# Patient Record
Sex: Female | Born: 1950 | Race: White | Hispanic: No | Marital: Married | Smoking: Never smoker
Health system: Southern US, Community
[De-identification: ages and names within clinical notes are randomized; demographics above are authoritative.]

## PROBLEM LIST (undated history)

## (undated) DIAGNOSIS — M459 Ankylosing spondylitis of unspecified sites in spine: Secondary | ICD-10-CM

## (undated) DIAGNOSIS — K449 Diaphragmatic hernia without obstruction or gangrene: Secondary | ICD-10-CM

## (undated) DIAGNOSIS — I10 Essential (primary) hypertension: Secondary | ICD-10-CM

## (undated) DIAGNOSIS — N951 Menopausal and female climacteric states: Secondary | ICD-10-CM

## (undated) DIAGNOSIS — R55 Syncope and collapse: Secondary | ICD-10-CM

## (undated) DIAGNOSIS — Z8711 Personal history of peptic ulcer disease: Secondary | ICD-10-CM

## (undated) DIAGNOSIS — E785 Hyperlipidemia, unspecified: Secondary | ICD-10-CM

## (undated) DIAGNOSIS — M810 Age-related osteoporosis without current pathological fracture: Secondary | ICD-10-CM

## (undated) DIAGNOSIS — Z87898 Personal history of other specified conditions: Secondary | ICD-10-CM

## (undated) DIAGNOSIS — K279 Peptic ulcer, site unspecified, unspecified as acute or chronic, without hemorrhage or perforation: Secondary | ICD-10-CM

## (undated) HISTORY — DX: Menopausal and female climacteric states: N95.1

## (undated) HISTORY — PX: BREAST REDUCTION SURGERY: SHX8

## (undated) HISTORY — DX: Hyperlipidemia, unspecified: E78.5

## (undated) HISTORY — DX: Ankylosing spondylitis of unspecified sites in spine: M45.9

## (undated) HISTORY — DX: Age-related osteoporosis without current pathological fracture: M81.0

## (undated) HISTORY — DX: Personal history of other specified conditions: Z87.898

## (undated) HISTORY — DX: Diaphragmatic hernia without obstruction or gangrene: K44.9

## (undated) HISTORY — DX: Personal history of peptic ulcer disease: Z87.11

## (undated) HISTORY — PX: COLONOSCOPY: SHX174

## (undated) HISTORY — PX: TONSILLECTOMY: SUR1361

---

## 1998-08-07 DIAGNOSIS — Z87898 Personal history of other specified conditions: Secondary | ICD-10-CM

## 1998-08-07 HISTORY — DX: Personal history of other specified conditions: Z87.898

## 2004-10-17 ENCOUNTER — Ambulatory Visit: Payer: Self-pay | Admitting: Unknown Physician Specialty

## 2005-01-03 ENCOUNTER — Ambulatory Visit: Payer: Self-pay | Admitting: Unknown Physician Specialty

## 2005-10-18 ENCOUNTER — Ambulatory Visit: Payer: Self-pay | Admitting: Internal Medicine

## 2006-12-04 ENCOUNTER — Ambulatory Visit: Payer: Self-pay | Admitting: Internal Medicine

## 2007-06-12 ENCOUNTER — Ambulatory Visit: Payer: Self-pay | Admitting: Physician Assistant

## 2007-07-10 ENCOUNTER — Ambulatory Visit: Payer: Self-pay | Admitting: Internal Medicine

## 2007-12-17 ENCOUNTER — Ambulatory Visit: Payer: Self-pay | Admitting: Internal Medicine

## 2008-06-17 ENCOUNTER — Ambulatory Visit: Payer: Self-pay | Admitting: Internal Medicine

## 2008-12-31 ENCOUNTER — Ambulatory Visit: Payer: Self-pay | Admitting: Internal Medicine

## 2010-01-11 ENCOUNTER — Ambulatory Visit: Payer: Self-pay | Admitting: Internal Medicine

## 2010-04-04 ENCOUNTER — Ambulatory Visit: Payer: Self-pay | Admitting: Unknown Physician Specialty

## 2011-01-23 ENCOUNTER — Ambulatory Visit: Payer: Self-pay | Admitting: Internal Medicine

## 2011-03-17 ENCOUNTER — Ambulatory Visit: Payer: Self-pay | Admitting: Physician Assistant

## 2011-08-08 HISTORY — PX: REDUCTION MAMMAPLASTY: SUR839

## 2011-09-05 ENCOUNTER — Ambulatory Visit: Payer: Self-pay

## 2012-01-24 ENCOUNTER — Ambulatory Visit: Payer: Self-pay | Admitting: Internal Medicine

## 2012-02-07 ENCOUNTER — Encounter: Payer: Self-pay | Admitting: Cardiovascular Disease

## 2012-02-16 ENCOUNTER — Encounter: Payer: Self-pay | Admitting: Cardiovascular Disease

## 2012-02-16 ENCOUNTER — Ambulatory Visit (INDEPENDENT_AMBULATORY_CARE_PROVIDER_SITE_OTHER): Payer: Medicare Other | Admitting: Cardiovascular Disease

## 2012-02-16 VITALS — BP 142/82 | HR 84 | Ht 58.5 in | Wt 166.5 lb

## 2012-02-16 DIAGNOSIS — I10 Essential (primary) hypertension: Secondary | ICD-10-CM | POA: Insufficient documentation

## 2012-02-16 DIAGNOSIS — K449 Diaphragmatic hernia without obstruction or gangrene: Secondary | ICD-10-CM | POA: Insufficient documentation

## 2012-02-16 DIAGNOSIS — E785 Hyperlipidemia, unspecified: Secondary | ICD-10-CM | POA: Insufficient documentation

## 2012-02-16 DIAGNOSIS — R0789 Other chest pain: Secondary | ICD-10-CM

## 2012-02-16 DIAGNOSIS — K219 Gastro-esophageal reflux disease without esophagitis: Secondary | ICD-10-CM

## 2012-02-16 NOTE — Assessment & Plan Note (Signed)
Cholesterol is at goal on the current lipid regimen. No changes to the medications were made.  

## 2012-02-16 NOTE — Progress Notes (Signed)
Patient ID: Terry Le, female    DOB: 02-04-1951, 61 y.o.   MRN: [id number]  HPI Comments: [name] is a very pleasant 61 year old woman with a history of obesity, GERD, hilar hernia, hyperlipidemia, syncope in 2000 with workup at that time but these details are unavailable, presented by referral for evaluation of chest discomfort.  She reports having a stress test several years ago which was reportedly normal. She states having years of GI symptoms. She has had workup including EGD, thoracic MRI showing peptic ulcer disease and moderate to large hiatal hernia. EGD in 2006 showing medium hiatal hernia, reactive gastropathy. She's recently been changed from one proton pump inhibitor to dexilant with improvement of her symptoms in the past 10 days. She has been weaning herself off ranitidine. No significant breakthrough symptoms. She has less waking at night time secondary to GI symptoms  She is active at baseline, helps to take care of 3 grandchildren ranging from 55-27 years old. They have very active. She went to the mall this past weekend and walked all day with no symptoms of chest discomfort. Heart rate at home has been averaging in the 70s.  EKG shows normal sinus rhythm with rate 84 beats per minute, no significant ST or T wave changes Recent labwork shows total cholesterol 160, LDL 67, HDL 53, triglycerides 200, normal LFTs, normal renal function   Outpatient Encounter Prescriptions as of 02/16/2012  Medication Sig Dispense Refill  . ALPRAZolam (XANAX) 0.25 MG tablet Take 0.25 mg by mouth at bedtime as needed.      . cyclobenzaprine (FLEXERIL) 10 MG tablet Take 10 mg by mouth 3 (three) times daily as needed.      Marland Kitchen dexlansoprazole (DEXILANT) 60 MG capsule Take 60 mg by mouth daily.      Marland Kitchen eletriptan (RELPAX) 40 MG tablet One tablet by mouth at onset of headache. May repeat in 2 hours if headache persists or recurs. may repeat in 2 hours if necessary      . multivitamin-lutein  (OCUVITE-LUTEIN) CAPS Take 1 capsule by mouth daily.      . propranolol ER (INDERAL LA) 80 MG 24 hr capsule Take 80 mg by mouth daily.      . ranitidine (ZANTAC) 300 MG tablet Take 150 mg by mouth every other day.       . rosuvastatin (CRESTOR) 10 MG tablet Take 10 mg by mouth daily.      . SUMAtriptan (IMITREX) 100 MG tablet Take 100 mg by mouth every 2 (two) hours as needed.        Review of Systems  Constitutional: Negative.   HENT: Negative.   Eyes: Negative.   Respiratory: Positive for chest tightness.   Cardiovascular: Negative.   Gastrointestinal: Negative.        GERD symptoms  Musculoskeletal: Negative.   Skin: Negative.   Neurological: Negative.   Hematological: Negative.   Psychiatric/Behavioral: Negative.   All other systems reviewed and are negative.    BP 142/82  Pulse 84  Ht 4' 10.5" (1.486 m)  Wt 166 lb 8 oz (75.524 kg)  BMI 34.21 kg/m2  Physical Exam  Nursing note and vitals reviewed. Constitutional: She is oriented to person, place, and time. She appears well-developed and well-nourished.  HENT:  Head: Normocephalic.  Nose: Nose normal.  Mouth/Throat: Oropharynx is clear and moist.  Eyes: Conjunctivae are normal. Pupils are equal, round, and reactive to light.  Neck: Normal range of motion. Neck supple. No JVD present.  Cardiovascular: Normal  rate, regular rhythm, S1 normal, S2 normal, normal heart sounds and intact distal pulses.  Exam reveals no gallop and no friction rub.   No murmur heard. Pulmonary/Chest: Effort normal and breath sounds normal. No respiratory distress. She has no wheezes. She has no rales. She exhibits no tenderness.  Abdominal: Soft. Bowel sounds are normal. She exhibits no distension. There is no tenderness.  Musculoskeletal: Normal range of motion. She exhibits no edema and no tenderness.  Lymphadenopathy:    She has no cervical adenopathy.  Neurological: She is alert and oriented to person, place, and time. Coordination normal.   Skin: Skin is warm and dry. No rash noted. No erythema.  Psychiatric: She has a normal mood and affect. Her behavior is normal. Judgment and thought content normal.         Assessment and Plan

## 2012-02-16 NOTE — Assessment & Plan Note (Signed)
Blood pressure borderline elevated today. She will monitor this at home. She could take extra half dose propranolol if needed for hypertension.

## 2012-02-16 NOTE — Assessment & Plan Note (Signed)
Atypical type chest pains likely secondary to hiatal hernia and GERD. Symptoms have resolved since she started dexilant. No symptoms with exertion. We have offered routine treadmill study. She has declined at this time which I feel is acceptable. She will call us if she has any worsening symptoms and we could set up a treadmill study.

## 2012-02-16 NOTE — Patient Instructions (Addendum)
You are doing well. No medication changes were made.  Please call for worsening chest pain  Please call us if you have new issues that need to be addressed before your next appt.  Your physician wants you to follow-up in: as needed

## 2012-02-16 NOTE — Assessment & Plan Note (Signed)
Hilar hernia is likely the cause of much of her symptoms. She has had this for years. She is not particularly interested in surgical repair if she can alleviate discomfort with dexilant and ranitidine PRN.

## 2012-08-06 ENCOUNTER — Telehealth: Payer: Self-pay

## 2012-08-06 NOTE — Telephone Encounter (Signed)
ERROR

## 2012-11-15 ENCOUNTER — Ambulatory Visit: Payer: Self-pay | Admitting: Physician Assistant

## 2013-01-27 ENCOUNTER — Ambulatory Visit: Payer: Self-pay | Admitting: Internal Medicine

## 2013-03-14 ENCOUNTER — Encounter: Payer: Self-pay | Admitting: Cardiovascular Disease

## 2013-03-14 ENCOUNTER — Ambulatory Visit (INDEPENDENT_AMBULATORY_CARE_PROVIDER_SITE_OTHER): Payer: Medicare Other | Admitting: Cardiovascular Disease

## 2013-03-14 VITALS — BP 138/86 | HR 72 | Ht <= 58 in | Wt 180.2 lb

## 2013-03-14 DIAGNOSIS — R0602 Shortness of breath: Secondary | ICD-10-CM

## 2013-03-14 DIAGNOSIS — R0789 Other chest pain: Secondary | ICD-10-CM

## 2013-03-14 DIAGNOSIS — E785 Hyperlipidemia, unspecified: Secondary | ICD-10-CM

## 2013-03-14 DIAGNOSIS — I10 Essential (primary) hypertension: Secondary | ICD-10-CM

## 2013-03-14 NOTE — Assessment & Plan Note (Signed)
Chest tightness is a typical, consistent with hiatal hernia. Prior cardiac workup negative. No further workup indicated at this time.

## 2013-03-14 NOTE — Patient Instructions (Addendum)
You are doing well. No medication changes were made.  Please call us if you have new issues that need to be addressed before your next appt.  Your physician wants you to follow-up in: 12 months.  You will receive a reminder letter in the mail two months in advance. If you don't receive a letter, please call our office to schedule the follow-up appointment. 

## 2013-03-14 NOTE — Assessment & Plan Note (Signed)
Blood pressure is well controlled on today's visit. No changes made to the medications. 

## 2013-03-14 NOTE — Assessment & Plan Note (Signed)
Cholesterol is at goal on the current lipid regimen. No changes to the medications were made.  

## 2013-03-14 NOTE — Progress Notes (Signed)
Patient ID: Terry Le, female    DOB: 1950-11-22, 62 y.o.   MRN: [id number]  HPI Comments: Terry Le is a very pleasant 62 year old woman with a history of obesity, GERD, hiatal hernia, hyperlipidemia, syncope in 2000 with workup at that time but these details are unavailable, previous history of chest pain with prior negative cardiac workup who presents for routine followup. Long history of upper chest discomfort from hiatal hernia.  Since her last clinic visit, she has had what sounds like diverticuli or diverticulitis. She had a course of antibiotics x2 with improvement of her symptoms. She reports having chronic low-grade fever at least she reports mildly elevated temperature a regular basis. Denies any recent abdominal pain. Does continue to have upper epigastric discomfort that wakes her at nighttime. Has been finds her sitting up in bed. Often times her fullness in her chest resolves with walking. She is known to have small meals before bed  She reports having a stress test several years ago which was reportedly normal.  She has had workup including EGD, thoracic MRI showing peptic ulcer disease and moderate to large hiatal hernia. EGD in 2006 showing medium hiatal hernia, reactive gastropathy. Some Improvement in her GI symptoms on dexilant.  She is active at baseline, helps to take care of 3 grandchildren ranging from 59-57 years old.  EKG shows normal sinus rhythm with rate 72 beats per minute, no significant ST or T wave changes  labwork shows total cholesterol 160, LDL 67, HDL 53, triglycerides 200, normal LFTs, normal renal function   Outpatient Encounter Prescriptions as of 03/14/2013  Medication Sig Dispense Refill  . ALPRAZolam (XANAX) 0.25 MG tablet Take 0.25 mg by mouth at bedtime as needed.      Marland Kitchen dexlansoprazole (DEXILANT) 60 MG capsule Take 60 mg by mouth daily.      Marland Kitchen eletriptan (RELPAX) 40 MG tablet One tablet by mouth at onset of headache. May repeat in 2 hours  if headache persists or recurs. may repeat in 2 hours if necessary      . ibandronate (BONIVA) 150 MG tablet Take 150 mg by mouth every 30 (thirty) days. Take in the morning with a full glass of water, on an empty stomach, and do not take anything else by mouth or lie down for the next 30 min.      . multivitamin-lutein (OCUVITE-LUTEIN) CAPS Take 1 capsule by mouth daily.      . propranolol ER (INDERAL LA) 80 MG 24 hr capsule Take 80 mg by mouth daily.      . rosuvastatin (CRESTOR) 10 MG tablet Take 10 mg by mouth daily.      . SUMAtriptan (IMITREX) 100 MG tablet Take 100 mg by mouth every 2 (two) hours as needed.        Review of Systems  Constitutional: Negative.   HENT: Negative.   Eyes: Negative.   Respiratory: Positive for chest tightness.   Cardiovascular: Negative.   Gastrointestinal: Negative.        GERD symptoms  Musculoskeletal: Negative.   Skin: Negative.   Neurological: Negative.   Psychiatric/Behavioral: Negative.   All other systems reviewed and are negative.    BP 138/86  Pulse 72  Ht 4\' 10"  (1.473 m)  Wt 180 lb 4 oz (81.761 kg)  BMI 37.68 kg/m2  Physical Exam  Nursing note and vitals reviewed. Constitutional: She is oriented to person, place, and time. She appears well-developed and well-nourished.  HENT:  Head: Normocephalic.  Nose: Nose normal.  Mouth/Throat: Oropharynx is clear and moist.  Eyes: Conjunctivae are normal. Pupils are equal, round, and reactive to light.  Neck: Normal range of motion. Neck supple. No JVD present.  Cardiovascular: Normal rate, regular rhythm, S1 normal, S2 normal, normal heart sounds and intact distal pulses.  Exam reveals no gallop and no friction rub.   No murmur heard. Pulmonary/Chest: Effort normal and breath sounds normal. No respiratory distress. She has no wheezes. She has no rales. She exhibits no tenderness.  Abdominal: Soft. Bowel sounds are normal. She exhibits no distension. There is no tenderness.   Musculoskeletal: Normal range of motion. She exhibits no edema and no tenderness.  Lymphadenopathy:    She has no cervical adenopathy.  Neurological: She is alert and oriented to person, place, and time. Coordination normal.  Skin: Skin is warm and dry. No rash noted. No erythema.  Psychiatric: She has a normal mood and affect. Her behavior is normal. Judgment and thought content normal.    Assessment and Plan

## 2013-06-12 ENCOUNTER — Other Ambulatory Visit: Payer: Self-pay

## 2013-12-11 ENCOUNTER — Ambulatory Visit: Payer: Self-pay | Admitting: Obstetrics & Gynecology

## 2013-12-11 LAB — APTT: ACTIVATED PTT: 29.7 s (ref 23.6–35.9)

## 2013-12-11 LAB — CBC
HCT: 38.9 % (ref 35.0–47.0)
HGB: 12.6 g/dL (ref 12.0–16.0)
MCH: 29.4 pg (ref 26.0–34.0)
MCHC: 32.3 g/dL (ref 32.0–36.0)
MCV: 91 fL (ref 80–100)
PLATELETS: 250 10*3/uL (ref 150–440)
RBC: 4.28 10*6/uL (ref 3.80–5.20)
RDW: 13.9 % (ref 11.5–14.5)
WBC: 6.3 10*3/uL (ref 3.6–11.0)

## 2013-12-11 LAB — BASIC METABOLIC PANEL
ANION GAP: 5 — AB (ref 7–16)
BUN: 8 mg/dL (ref 7–18)
CHLORIDE: 109 mmol/L — AB (ref 98–107)
CREATININE: 0.66 mg/dL (ref 0.60–1.30)
Calcium, Total: 8.7 mg/dL (ref 8.5–10.1)
Co2: 27 mmol/L (ref 21–32)
EGFR (African American): >60
EGFR (Non-African Amer.): >60
Glucose: 75 mg/dL (ref 65–99)
Osmolality: 278 (ref 275–301)
Potassium: 4.2 mmol/L (ref 3.5–5.1)
SODIUM: 141 mmol/L (ref 136–145)

## 2013-12-11 LAB — PROTIME-INR
INR: 0.9
Prothrombin Time: 12.5 secs (ref 11.5–14.7)

## 2013-12-16 ENCOUNTER — Ambulatory Visit: Payer: Self-pay | Admitting: Obstetrics & Gynecology

## 2013-12-17 LAB — PATHOLOGY REPORT

## 2013-12-17 LAB — HEMOGLOBIN: HGB: 12.5 g/dL (ref 12.0–16.0)

## 2014-03-10 ENCOUNTER — Ambulatory Visit: Payer: Self-pay | Admitting: Internal Medicine

## 2014-03-16 ENCOUNTER — Encounter: Payer: Self-pay | Admitting: Cardiovascular Disease

## 2014-03-16 ENCOUNTER — Ambulatory Visit (INDEPENDENT_AMBULATORY_CARE_PROVIDER_SITE_OTHER): Payer: Medicare Other | Admitting: Cardiovascular Disease

## 2014-03-16 VITALS — BP 142/98 | HR 85 | Ht 59.0 in | Wt 176.5 lb

## 2014-03-16 DIAGNOSIS — R0789 Other chest pain: Secondary | ICD-10-CM

## 2014-03-16 DIAGNOSIS — E785 Hyperlipidemia, unspecified: Secondary | ICD-10-CM

## 2014-03-16 DIAGNOSIS — I158 Other secondary hypertension: Secondary | ICD-10-CM

## 2014-03-16 NOTE — Assessment & Plan Note (Signed)
Cholesterol is at goal on the current lipid regimen. No changes to the medications were made.  

## 2014-03-16 NOTE — Patient Instructions (Signed)
You are doing well. No medication changes were made.  Please monitor your blood pressure Call us with numbers  Goal is <140 on the top, <90 on the bottom  Please call us if you have new issues that need to be addressed before your next appt.  Your physician wants you to follow-up in: 12 months.  You will receive a reminder letter in the mail two months in advance. If you don't receive a letter, please call our office to schedule the follow-up appointment.

## 2014-03-16 NOTE — Assessment & Plan Note (Signed)
She denies having any recent episodes of chest discomfort

## 2014-03-16 NOTE — Assessment & Plan Note (Signed)
We have recommended that she monitor her blood pressure at home and call our office with measurements. Medication adjustment after we see her home numbers

## 2014-03-16 NOTE — Progress Notes (Signed)
Patient ID: Terry Le, female    DOB: Aug 24, 1950, 63 y.o.   MRN: [id number]  HPI Comments: Terry Le is a very pleasant 63 year old woman with a history of obesity, GERD, hiatal hernia, hyperlipidemia, syncope in 2000 with workup at that time but these details are unavailable, previous history of chest pain with prior negative cardiac workup who presents for routine followup. Long history of upper chest discomfort from hiatal hernia. Previous diverticuli or diverticulitis. Requiring antibiotics x2 with improvement of her symptoms.  On today's visit, she reports that she has had a hysterectomy in May 2015, urinary tract infection. She has since recovered and otherwise feels well. Blood pressure has been running mildly elevated at home. She feels that her blood pressure is elevated today as she is developing a migraine headache  Total cholesterol 146, LDL 61 in June 2015  She reports having a stress test several years ago which was reportedly normal.  She has had workup including EGD, thoracic MRI showing peptic ulcer disease and moderate to large hiatal hernia. EGD in 2006 showing medium hiatal hernia, reactive gastropathy. Some Improvement in her GI symptoms on dexilant.  She is active at baseline, helps to take care of 3 grandchildren ranging from 18-56 years old.  EKG shows normal sinus rhythm with  no significant ST or T wave changes   Outpatient Encounter Prescriptions as of 03/16/2014  Medication Sig  . ALPRAZolam (XANAX) 0.25 MG tablet Take 0.25 mg by mouth at bedtime as needed.  Marland Kitchen aspirin 81 MG tablet Take 81 mg by mouth daily.  . Cholecalciferol (VITAMIN D) 2000 UNITS CAPS Take by mouth.  . dexlansoprazole (DEXILANT) 60 MG capsule Take 60 mg by mouth daily.  Marland Kitchen eletriptan (RELPAX) 40 MG tablet One tablet by mouth at onset of headache. May repeat in 2 hours if headache persists or recurs. may repeat in 2 hours if necessary  . ibandronate (BONIVA) 150 MG tablet Take 150  mg by mouth every 30 (thirty) days. Take in the morning with a full glass of water, on an empty stomach, and do not take anything else by mouth or lie down for the next 30 min.  . multivitamin-lutein (OCUVITE-LUTEIN) CAPS Take 1 capsule by mouth daily.  . propranolol ER (INDERAL LA) 80 MG 24 hr capsule Take 80 mg by mouth daily.  . rosuvastatin (CRESTOR) 10 MG tablet Take 10 mg by mouth daily.  . SUMAtriptan (IMITREX) 100 MG tablet Take 100 mg by mouth every 2 (two) hours as needed.    Review of Systems  Constitutional: Negative.   HENT: Negative.   Eyes: Negative.   Respiratory: Positive for chest tightness.   Cardiovascular: Negative.   Gastrointestinal: Negative.        GERD symptoms  Endocrine: Negative.   Musculoskeletal: Negative.   Skin: Negative.   Allergic/Immunologic: Negative.   Neurological: Negative.   Hematological: Negative.   Psychiatric/Behavioral: Negative.   All other systems reviewed and are negative.   BP 142/98  Ht 4\' 11"  (1.499 m)  Wt 176 lb 8 oz (80.06 kg)  BMI 35.63 kg/m2  Physical Exam  Nursing note and vitals reviewed. Constitutional: She is oriented to person, place, and time. She appears well-developed and well-nourished.  HENT:  Head: Normocephalic.  Nose: Nose normal.  Mouth/Throat: Oropharynx is clear and moist.  Eyes: Conjunctivae are normal. Pupils are equal, round, and reactive to light.  Neck: Normal range of motion. Neck supple. No JVD present.  Cardiovascular: Normal rate, regular rhythm, S1  normal, S2 normal, normal heart sounds and intact distal pulses.  Exam reveals no gallop and no friction rub.   No murmur heard. Pulmonary/Chest: Effort normal and breath sounds normal. No respiratory distress. She has no wheezes. She has no rales. She exhibits no tenderness.  Abdominal: Soft. Bowel sounds are normal. She exhibits no distension. There is no tenderness.  Musculoskeletal: Normal range of motion. She exhibits no edema and no  tenderness.  Lymphadenopathy:    She has no cervical adenopathy.  Neurological: She is alert and oriented to person, place, and time. Coordination normal.  Skin: Skin is warm and dry. No rash noted. No erythema.  Psychiatric: She has a normal mood and affect. Her behavior is normal. Judgment and thought content normal.    Assessment and Plan

## 2014-03-23 ENCOUNTER — Encounter: Payer: Self-pay | Admitting: Cardiovascular Disease

## 2014-03-24 ENCOUNTER — Other Ambulatory Visit: Payer: Self-pay | Admitting: Cardiovascular Disease

## 2014-03-25 NOTE — Telephone Encounter (Signed)
Would send in losartan 25 mg daily Continue to monitor blood pressure

## 2014-03-26 MED ORDER — LOSARTAN POTASSIUM 25 MG PO TABS: 25.0000 mg | ORAL_TABLET | Freq: Every day | ORAL | Status: DC

## 2014-03-26 NOTE — Telephone Encounter (Signed)
This encounter was created in error - please disregard.

## 2014-08-25 ENCOUNTER — Encounter (INDEPENDENT_AMBULATORY_CARE_PROVIDER_SITE_OTHER): Payer: Medicare Other | Admitting: Ophthalmology

## 2014-08-25 DIAGNOSIS — H3531 Nonexudative age-related macular degeneration: Secondary | ICD-10-CM

## 2014-08-25 DIAGNOSIS — H35033 Hypertensive retinopathy, bilateral: Secondary | ICD-10-CM

## 2014-08-25 DIAGNOSIS — H43813 Vitreous degeneration, bilateral: Secondary | ICD-10-CM

## 2014-08-25 DIAGNOSIS — I10 Essential (primary) hypertension: Secondary | ICD-10-CM

## 2014-11-28 NOTE — Op Note (Signed)
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   PROCEDURE PERFORMED: Total vaginal hysterectomy with bilateral salpingo-oophorectomy and anterior colporrhaphy.   SURGEON:  Barnett Applebaum, M.D.   ASSISTANT:  Malachy Mood, M.D.   ANESTHESIA:  General.   ESTIMATED BLOOD LOSS:  25 mL.   COMPLICATIONS:  None.   FINDINGS:  Small uterus with atrophic ovaries.   DISPOSITION:  To the recovery room in stable condition.   TECHNIQUE:  The patient is prepped and draped in the usual sterile fashion after adequate anesthesia is obtained in the dorsal lithotomy position. The bladder is drained with a Robinson catheter. A speculum is placed and the cervix is identified and grasped with a double-tooth tenaculum. The circumference of the cervix is infiltrated with 1% lidocaine with epinephrine and then the circumference of the cervix is incised using Bovie electrocautery. Dissection with Metzenbaum scissors into the posterior peritoneum is performed, and a long weighted speculum is placed. The uterosacral ligaments were identified, clamped, transected and suture ligated, and then sutured to the vaginal cuff. The uterine arteries and a portion of the broad ligament connected with this is then clamped, transected and suture ligated. The broad ligament complex up to the cornua is then clamped, transected and suture ligated with amputation of the uterus with cervix. The right and left fallopian tubes and their ovaries are identified and grasped with a Babcock clamp. The infundibulopelvic blood vessels and pedicle were then clamped, transected  and suture ligated. Inspection of bowel and bladder reveals no injury or bleeding at this time.   The peritoneum is closed with a  running 0 Vicryl suture in a pursestring fashion. The uterosacral ligaments are plicated using a #1 Ethibond suture.   The anterior vaginal wall is identified with the distal cystocele. Allis clamps are placed along the midline and an incision is made with the scalpel. The endopelvic fascia is dissected away from the vaginal mucosa. The endopelvic fascia is then plicated with two 0 Vicryl sutures. Excess vaginal mucosa is excised. The vaginal mucosa is then closed with a running 2-0 Vicryl suture in a locking fashion to incorporate both the anterior colporrhaphy site and the vaginal hysterectomy site. The vaginal cavity is irrigated with saline. A packing sponge is placed coated with Premarin vaginal cream. A Foley catheter is placed. The patient goes to the recovery room in stable condition. All sponge, instrument and needle counts are correct at the conclusion of the case.      ____________________________ R. Barnett Applebaum, MD rph:dmm D: 12/16/2013 12:51:49 ET T: 12/16/2013 13:22:50 ET JOB#: [id number]  cc: Glean Salen, MD, <Dictator> Gae Dry MD ELECTRONICALLY SIGNED 12/16/2013 14:58

## 2015-02-15 ENCOUNTER — Other Ambulatory Visit: Payer: Self-pay | Admitting: Internal Medicine

## 2015-02-15 DIAGNOSIS — Z1231 Encounter for screening mammogram for malignant neoplasm of breast: Secondary | ICD-10-CM

## 2015-03-15 ENCOUNTER — Ambulatory Visit
Admission: RE | Admit: 2015-03-15 | Discharge: 2015-03-15 | Disposition: A | Discharge status: Home / Self Care | Payer: Medicare Other | Source: Ambulatory Visit | Attending: Internal Medicine | Admitting: Internal Medicine

## 2015-03-15 ENCOUNTER — Other Ambulatory Visit: Payer: Self-pay | Admitting: Internal Medicine

## 2015-03-15 DIAGNOSIS — Z1231 Encounter for screening mammogram for malignant neoplasm of breast: Secondary | ICD-10-CM | POA: Diagnosis not present

## 2015-03-31 ENCOUNTER — Encounter: Payer: Self-pay | Admitting: Cardiovascular Disease

## 2015-03-31 ENCOUNTER — Ambulatory Visit (INDEPENDENT_AMBULATORY_CARE_PROVIDER_SITE_OTHER): Payer: Medicare Other | Admitting: Cardiovascular Disease

## 2015-03-31 VITALS — BP 110/76 | HR 87 | Ht <= 58 in | Wt 174.2 lb

## 2015-03-31 DIAGNOSIS — E785 Hyperlipidemia, unspecified: Secondary | ICD-10-CM

## 2015-03-31 DIAGNOSIS — R0789 Other chest pain: Secondary | ICD-10-CM

## 2015-03-31 DIAGNOSIS — I158 Other secondary hypertension: Secondary | ICD-10-CM

## 2015-03-31 NOTE — Assessment & Plan Note (Signed)
Blood pressure is well controlled on today's visit. No changes made to the medications. 

## 2015-03-31 NOTE — Progress Notes (Signed)
Patient ID: Terry Le, female    DOB: 1950-08-08, 64 y.o.   MRN: [id number]  HPI Comments: Terry Le is a very pleasant 64 year old woman with a history of obesity, GERD, hiatal hernia, hyperlipidemia, syncope in 2000 with workup at that time but these details are unavailable, previous history of chest pain with prior negative cardiac workup who presents for routine followup of her hypertension and previous episodes of chest pain. Long history of upper chest discomfort from hiatal hernia. Previous diverticuli or diverticulitis. Requiring antibiotics x2 with improvement of her symptoms.  In follow-up today, she reports having no significant chest pain Hiatal hernia symptoms have been well-controlled No regular exercise program but is active Lab work through primary care recently done shows well controlled cholesterol, other labs are normal. Numbers reviewed with her  EKG on today's visit shows normal sinus rhythm with rate 87 bpm, no significant ST or T-wave changes  Other past medical history  hysterectomy in May 2015, urinary tract infection.  She reports having a stress test several years ago which was reportedly normal.  She has had workup including EGD, thoracic MRI showing peptic ulcer disease and moderate to large hiatal hernia. EGD in 2006 showing medium hiatal hernia, reactive gastropathy. Some Improvement in her GI symptoms on dexilant.  She is active at baseline, helps to take care of 3 grandchildren ranging from 85-17 years old.  No Known Allergies  Current Outpatient Prescriptions on File Prior to Visit  Medication Sig Dispense Refill  . ALPRAZolam (XANAX) 0.25 MG tablet Take 0.25 mg by mouth at bedtime as needed.    Marland Kitchen aspirin 81 MG tablet Take 81 mg by mouth daily.    Marland Kitchen dexlansoprazole (DEXILANT) 60 MG capsule Take 60 mg by mouth daily.    Marland Kitchen eletriptan (RELPAX) 40 MG tablet One tablet by mouth at onset of headache. May repeat in 2 hours if headache persists or  recurs. may repeat in 2 hours if necessary    . ibandronate (BONIVA) 150 MG tablet Take 150 mg by mouth every 30 (thirty) days. Take in the morning with a full glass of water, on an empty stomach, and do not take anything else by mouth or lie down for the next 30 min.    . propranolol ER (INDERAL LA) 80 MG 24 hr capsule Take 80 mg by mouth daily.    . rosuvastatin (CRESTOR) 10 MG tablet Take 10 mg by mouth daily.    . SUMAtriptan (IMITREX) 100 MG tablet Take 100 mg by mouth every 2 (two) hours as needed.     No current facility-administered medications on file prior to visit.    Past Medical History  Diagnosis Date  . Hyperlipidemia   . History of syncope 2000    w/negative work up  . History of peptic ulcer disease   . Ankylosing spondylitis   . Osteoporosis   . Menopausal state   . Hiatal hernia     Past Surgical History  Procedure Laterality Date  . Breast reduction surgery    . Tonsillectomy    . Colonoscopy    . Reduction mammaplasty  2015    Social History  reports that she has never smoked. She does not have any smokeless tobacco history on file. She reports that she does not drink alcohol or use illicit drugs.  Family History family history includes Breast cancer (age of onset: 47) in her maternal aunt; Heart attack in her father.   Review of Systems  Constitutional: Negative.  Respiratory: Negative.   Cardiovascular: Negative.   Gastrointestinal: Negative.        GERD symptoms  Musculoskeletal: Negative.   Skin: Negative.   Allergic/Immunologic: Negative.   Neurological: Negative.   Hematological: Negative.   Psychiatric/Behavioral: Negative.   All other systems reviewed and are negative.   BP 110/76 mmHg  Pulse 87  Ht 4\' 10"  (1.473 m)  Wt 174 lb 4 oz (79.039 kg)  BMI 36.43 kg/m2  Physical Exam  Constitutional: She is oriented to person, place, and time. She appears well-developed and well-nourished.  HENT:  Head: Normocephalic.  Nose: Nose  normal.  Mouth/Throat: Oropharynx is clear and moist.  Eyes: Conjunctivae are normal. Pupils are equal, round, and reactive to light.  Neck: Normal range of motion. Neck supple. No JVD present.  Cardiovascular: Normal rate, regular rhythm, S1 normal, S2 normal, normal heart sounds and intact distal pulses.  Exam reveals no gallop and no friction rub.   No murmur heard. Pulmonary/Chest: Effort normal and breath sounds normal. No respiratory distress. She has no wheezes. She has no rales. She exhibits no tenderness.  Abdominal: Soft. Bowel sounds are normal. She exhibits no distension. There is no tenderness.  Musculoskeletal: Normal range of motion. She exhibits no edema or tenderness.  Lymphadenopathy:    She has no cervical adenopathy.  Neurological: She is alert and oriented to person, place, and time. Coordination normal.  Skin: Skin is warm and dry. No rash noted. No erythema.  Psychiatric: She has a normal mood and affect. Her behavior is normal. Judgment and thought content normal.    Assessment and Plan  Nursing note and vitals reviewed.

## 2015-03-31 NOTE — Assessment & Plan Note (Signed)
No recent chest pain symptoms. No further workup at this time

## 2015-03-31 NOTE — Assessment & Plan Note (Signed)
Cholesterol is at goal on the current lipid regimen. No changes to the medications were made.  

## 2015-03-31 NOTE — Patient Instructions (Signed)
You are doing well. No medication changes were made.  Please call us if you have new issues that need to be addressed before your next appt.  Your physician wants you to follow-up in: 12 months.  You will receive a reminder letter in the mail two months in advance. If you don't receive a letter, please call our office to schedule the follow-up appointment. 

## 2015-04-27 ENCOUNTER — Encounter: Payer: Self-pay | Admitting: *Deleted

## 2015-04-28 ENCOUNTER — Encounter: Payer: Self-pay | Admitting: *Deleted

## 2015-04-28 ENCOUNTER — Ambulatory Visit
Admission: RE | Admit: 2015-04-28 | Discharge: 2015-04-28 | Disposition: A | Discharge status: Home / Self Care | Payer: Medicare Other | Source: Ambulatory Visit | Attending: Unknown Physician Specialty | Admitting: Unknown Physician Specialty

## 2015-04-28 ENCOUNTER — Ambulatory Visit: Payer: Medicare Other | Admitting: Certified Registered Nurse Anesthetist

## 2015-04-28 ENCOUNTER — Encounter
Admission: RE | Disposition: A | Discharge status: Home / Self Care | Payer: Self-pay | Source: Ambulatory Visit | Attending: Unknown Physician Specialty

## 2015-04-28 DIAGNOSIS — G709 Myoneural disorder, unspecified: Secondary | ICD-10-CM | POA: Diagnosis not present

## 2015-04-28 DIAGNOSIS — K640 First degree hemorrhoids: Secondary | ICD-10-CM | POA: Insufficient documentation

## 2015-04-28 DIAGNOSIS — I10 Essential (primary) hypertension: Secondary | ICD-10-CM | POA: Insufficient documentation

## 2015-04-28 DIAGNOSIS — Z780 Asymptomatic menopausal state: Secondary | ICD-10-CM | POA: Insufficient documentation

## 2015-04-28 DIAGNOSIS — Z7982 Long term (current) use of aspirin: Secondary | ICD-10-CM | POA: Diagnosis not present

## 2015-04-28 DIAGNOSIS — Z86010 Personal history of colonic polyps: Secondary | ICD-10-CM | POA: Diagnosis present

## 2015-04-28 DIAGNOSIS — E785 Hyperlipidemia, unspecified: Secondary | ICD-10-CM | POA: Insufficient documentation

## 2015-04-28 DIAGNOSIS — Z8711 Personal history of peptic ulcer disease: Secondary | ICD-10-CM | POA: Diagnosis not present

## 2015-04-28 DIAGNOSIS — Z79899 Other long term (current) drug therapy: Secondary | ICD-10-CM | POA: Diagnosis not present

## 2015-04-28 DIAGNOSIS — M810 Age-related osteoporosis without current pathological fracture: Secondary | ICD-10-CM | POA: Insufficient documentation

## 2015-04-28 DIAGNOSIS — D120 Benign neoplasm of cecum: Secondary | ICD-10-CM | POA: Insufficient documentation

## 2015-04-28 DIAGNOSIS — Z7952 Long term (current) use of systemic steroids: Secondary | ICD-10-CM | POA: Diagnosis not present

## 2015-04-28 HISTORY — DX: Peptic ulcer, site unspecified, unspecified as acute or chronic, without hemorrhage or perforation: K27.9

## 2015-04-28 HISTORY — DX: Syncope and collapse: R55

## 2015-04-28 HISTORY — PX: COLONOSCOPY WITH PROPOFOL: SHX5780

## 2015-04-28 HISTORY — DX: Essential (primary) hypertension: I10

## 2015-04-28 SURGERY — COLONOSCOPY WITH PROPOFOL
Anesthesia: General

## 2015-04-28 MED ORDER — SODIUM CHLORIDE 0.9 % IV SOLN: INTRAVENOUS | Status: DC

## 2015-04-28 MED ORDER — PROPOFOL 10 MG/ML IV BOLUS
INTRAVENOUS | Status: DC | PRN
  Administered 2015-04-28: 15 mg via INTRAVENOUS
  Administered 2015-04-28: 55 mg via INTRAVENOUS
  Administered 2015-04-28: 15 mg via INTRAVENOUS

## 2015-04-28 MED ORDER — FENTANYL CITRATE (PF) 100 MCG/2ML IJ SOLN
INTRAMUSCULAR | Status: DC | PRN
  Administered 2015-04-28: 50 ug via INTRAVENOUS

## 2015-04-28 MED ORDER — MIDAZOLAM HCL 5 MG/5ML IJ SOLN
INTRAMUSCULAR | Status: DC | PRN
  Administered 2015-04-28: 1 mg via INTRAVENOUS

## 2015-04-28 MED ORDER — SODIUM CHLORIDE 0.9 % IV SOLN
INTRAVENOUS | Status: DC
  Administered 2015-04-28: 13:00:00 via INTRAVENOUS

## 2015-04-28 MED ORDER — PROPOFOL INFUSION 10 MG/ML OPTIME
INTRAVENOUS | Status: DC | PRN
  Administered 2015-04-28: 100 ug/kg/min via INTRAVENOUS

## 2015-04-28 NOTE — Op Note (Signed)
[facility] Patient Name: Terry Le Procedure Date: 04/28/2015 1:12 PM MRN: [id number] Account #: [id number] Date of Birth: [birth year] Admit Type: Outpatient Age: 64 Room: [facility] ENDO ROOM 4 Gender: Female Note Status: Finalized Procedure:         Colonoscopy Indications:       Follow-up for history of adenomatous polyps in the colon Providers:         Manya Silvas, MD Referring MD:      Leonie Douglas. Doy Hutching, MD (Referring MD) Medicines:         Propofol per Anesthesia Complications:     No immediate complications. Procedure:         Pre-Anesthesia Assessment:                    - After reviewing the risks and benefits, the patient was                     deemed in satisfactory condition to undergo the procedure.                    After obtaining informed consent, the colonoscope was                     passed under direct vision. Throughout the procedure, the                     patient's blood pressure, pulse, and oxygen saturations                     were monitored continuously. The Colonoscope was                     introduced through the anus and advanced to the the cecum,                     identified by appendiceal orifice and ileocecal valve. The                     patient tolerated the procedure well. The colonoscopy was                     somewhat difficult due to significant looping and a                     tortuous colon. Successful completion of the procedure was                     aided by applying abdominal pressure. The patient                     tolerated the procedure well. The quality of the bowel                     preparation was excellent. Findings:      Internal hemorrhoids were found during endoscopy. The hemorrhoids were       small and Grade I (internal hemorrhoids that do not prolapse).      The exam was otherwise without abnormality.      A diminutive polyp was found in the cecum. The polyp was  sessile. The       polyp was removed with a jumbo cold forceps. Resection and retrieval       were complete. Impression:        -  Internal hemorrhoids.                    - The examination was otherwise normal.                    - No specimens collected. Recommendation:    - Await pathology results. Manya Silvas, MD 04/28/2015 1:45:50 PM This report has been signed electronically. Number of Addenda: 0 Note Initiated On: 04/28/2015 1:12 PM Scope Withdrawal Time: 0 hours 8 minutes 39 seconds  Total Procedure Duration: 0 hours 23 minutes 11 seconds       [facility]

## 2015-04-28 NOTE — Anesthesia Preprocedure Evaluation (Signed)
Anesthesia Evaluation  Patient identified by MRN, date of birth, ID band Patient awake    Reviewed: Allergy & Precautions, H&P , NPO status , Patient's Chart, lab work & pertinent test results  Airway Mallampati: III  TM Distance: >3 FB Neck ROM: limited    Dental  (+) Poor Dentition   Pulmonary neg pulmonary ROS,    Pulmonary exam normal breath sounds clear to auscultation       Cardiovascular Exercise Tolerance: Good hypertension, (-) Past MI Normal cardiovascular exam Rhythm:regular Rate:Normal     Neuro/Psych  Neuromuscular disease negative psych ROS   GI/Hepatic Neg liver ROS, hiatal hernia, PUD, GERD  Medicated and Controlled,  Endo/Other  negative endocrine ROS  Renal/GU negative Renal ROS  negative genitourinary   Musculoskeletal   Abdominal   Peds  Hematology negative hematology ROS (+)   Anesthesia Other Findings Past Medical History:   Hyperlipidemia                                               History of syncope                              2000           Comment:w/negative work up   History of peptic ulcer disease                              Ankylosing spondylitis                                       Osteoporosis                                                 Menopausal state                                             Hiatal hernia                                                Hypertension                                                 Osteoporosis                                                 Hyperlipidemia  Ankylosing spondylitis                                       Syncope                                                      PUD (peptic ulcer disease)                                   Reproductive/Obstetrics negative OB ROS                             Anesthesia Physical Anesthesia Plan  ASA: III  Anesthesia  Plan: General   Post-op Pain Management:    Induction:   Airway Management Planned:   Additional Equipment:   Intra-op Plan:   Post-operative Plan:   Informed Consent: I have reviewed the patients History and Physical, chart, labs and discussed the procedure including the risks, benefits and alternatives for the proposed anesthesia with the patient or authorized representative who has indicated his/her understanding and acceptance.   Dental Advisory Given  Plan Discussed with: Anesthesiologist, CRNA and Surgeon  Anesthesia Plan Comments:         Anesthesia Quick Evaluation

## 2015-04-28 NOTE — Anesthesia Postprocedure Evaluation (Signed)
  Anesthesia Post-op Note  Patient: Terry Le  Procedure(s) Performed: Procedure(s): COLONOSCOPY WITH PROPOFOL (N/A)  Anesthesia type:General  Patient location: PACU  Post pain: Pain level controlled  Post assessment: Post-op Vital signs reviewed, Patient's Cardiovascular Status Stable, Respiratory Function Stable, Patent Airway and No signs of Nausea or vomiting  Post vital signs: Reviewed and stable  Last Vitals:  Filed Vitals:   04/28/15 1415  BP: 118/77  Pulse: 90  Temp:   Resp: 15    Level of consciousness: awake, alert  and patient cooperative  Complications: No apparent anesthesia complications

## 2015-04-28 NOTE — Transfer of Care (Signed)
Immediate Anesthesia Transfer of Care Note  Patient: Terry Le  Procedure(s) Performed: Procedure(s): COLONOSCOPY WITH PROPOFOL (N/A)  Patient Location: PACU  Anesthesia Type:General  Level of Consciousness: sedated  Airway & Oxygen Therapy: Patient Spontanous Breathing and Patient connected to nasal cannula oxygen  Post-op Assessment: Report given to RN and Post -op Vital signs reviewed and stable  Post vital signs: Reviewed and stable  Last Vitals:  Filed Vitals:   04/28/15 1345  BP: 102/77  Pulse:   Temp: 36 C  Resp: 17    Complications: No apparent anesthesia complications

## 2015-04-28 NOTE — H&P (Signed)
Primary Care Physician:  Idelle Crouch, MD Primary Gastroenterologist:  Dr. Vira Agar  Pre-Procedure History & Physical: HPI:  Terry Le is a 64 y.o. female is here for an colonoscopy.   Past Medical History  Diagnosis Date  . Hyperlipidemia   . History of syncope 2000    w/negative work up  . History of peptic ulcer disease   . Ankylosing spondylitis   . Osteoporosis   . Menopausal state   . Hiatal hernia   . Hypertension   . Osteoporosis   . Hyperlipidemia   . Ankylosing spondylitis   . Syncope   . PUD (peptic ulcer disease)     Past Surgical History  Procedure Laterality Date  . Breast reduction surgery    . Tonsillectomy    . Colonoscopy    . Reduction mammaplasty  2015    Prior to Admission medications   Medication Sig Start Date End Date Taking? Authorizing Provider  ALPRAZolam (XANAX) 0.25 MG tablet Take 0.25 mg by mouth at bedtime as needed.   Yes Historical Provider, MD  aspirin 81 MG tablet Take 81 mg by mouth daily.   Yes Historical Provider, MD  Cholecalciferol (VITAMIN D PO) Take 5,000 mg by mouth daily.   Yes Historical Provider, MD  dexlansoprazole (DEXILANT) 60 MG capsule Take 60 mg by mouth daily.   Yes Historical Provider, MD  eletriptan (RELPAX) 40 MG tablet One tablet by mouth at onset of headache. May repeat in 2 hours if headache persists or recurs. may repeat in 2 hours if necessary   Yes Historical Provider, MD  ibandronate (BONIVA) 150 MG tablet Take 150 mg by mouth every 30 (thirty) days. Take in the morning with a full glass of water, on an empty stomach, and do not take anything else by mouth or lie down for the next 30 min.   Yes Historical Provider, MD  losartan (COZAAR) 50 MG tablet Take 50 mg by mouth daily.   Yes Historical Provider, MD  Multiple Vitamins-Minerals (PRESERVISION AREDS 2 PO) Take by mouth daily.   Yes Historical Provider, MD  propranolol ER (INDERAL LA) 80 MG 24 hr capsule Take 80 mg by mouth daily.   Yes  Historical Provider, MD  rosuvastatin (CRESTOR) 10 MG tablet Take 10 mg by mouth daily.   Yes Historical Provider, MD  SUMAtriptan (IMITREX) 100 MG tablet Take 100 mg by mouth every 2 (two) hours as needed.   Yes Historical Provider, MD  predniSONE (STERAPRED UNI-PAK 21 TAB) 10 MG (21) TBPK tablet Take 10 mg by mouth as directed.    Historical Provider, MD    Allergies as of 04/06/2015  . (No Known Allergies)    Family History  Problem Relation Age of Onset  . Heart attack Father   . Breast cancer Maternal Aunt 34    Social History   Social History  . Marital Status: Married    Spouse Name: N/A  . Number of Children: N/A  . Years of Education: N/A   Occupational History  . Not on file.   Social History Main Topics  . Smoking status: Never Smoker   . Smokeless tobacco: Not on file  . Alcohol Use: No  . Drug Use: No  . Sexual Activity: Not on file   Other Topics Concern  . Not on file   Social History Narrative    Review of Systems: See HPI, otherwise negative ROS  Physical Exam: BP 145/99 mmHg  Pulse 99  Temp(Src) 98.9 F (  37.2 C) (Tympanic)  Resp 16  Ht 4\' 10"  (1.473 m)  Wt 78.926 kg (174 lb)  BMI 36.38 kg/m2  SpO2 99% General:   Alert,  pleasant and cooperative in NAD Head:  Normocephalic and atraumatic. Neck:  Supple; no masses or thyromegaly. Lungs:  Clear throughout to auscultation.    Heart:  Regular rate and rhythm. Abdomen:  Soft, nontender and nondistended. Normal bowel sounds, without guarding, and without rebound.   Neurologic:  Alert and  oriented x4;  grossly normal neurologically.  Impression/Plan: Terry Le is here for an colonoscopy to be performed for [facility] colon polyps  Risks, benefits, limitations, and alternatives regarding  colonoscopy have been reviewed with the patient.  Questions have been answered.  All parties agreeable.   Gaylyn Cheers, MD  04/28/2015, 1:12 PM

## 2015-04-29 ENCOUNTER — Encounter: Payer: Self-pay | Admitting: Unknown Physician Specialty

## 2015-04-29 LAB — SURGICAL PATHOLOGY

## 2016-03-09 ENCOUNTER — Other Ambulatory Visit: Payer: Self-pay | Admitting: Internal Medicine

## 2016-03-09 DIAGNOSIS — Z1231 Encounter for screening mammogram for malignant neoplasm of breast: Secondary | ICD-10-CM

## 2016-03-22 ENCOUNTER — Other Ambulatory Visit: Payer: Self-pay | Admitting: Internal Medicine

## 2016-03-22 ENCOUNTER — Ambulatory Visit
Admission: RE | Admit: 2016-03-22 | Discharge: 2016-03-22 | Disposition: A | Discharge status: Home / Self Care | Payer: Medicare Other | Source: Ambulatory Visit | Attending: Internal Medicine | Admitting: Internal Medicine

## 2016-03-22 DIAGNOSIS — Z1231 Encounter for screening mammogram for malignant neoplasm of breast: Secondary | ICD-10-CM | POA: Insufficient documentation

## 2016-06-14 ENCOUNTER — Telehealth: Payer: Self-pay | Admitting: Cardiovascular Disease

## 2016-06-14 NOTE — Telephone Encounter (Signed)
Patient states she is doing fine and doesn't need to fu

## 2016-06-14 NOTE — Telephone Encounter (Signed)
Deleting recall.

## 2016-11-15 IMAGING — MG MM SCREENING BREAST TOMO BILATERAL
8 of 13 series · 8 of 29 positions shown · non-contrast
Comparison: Previous exam(s).

CLINICAL DATA: Screening.

EXAM:
DIGITAL SCREENING BILATERAL MAMMOGRAM WITH 3D TOMO WITH CAD

[R MLO]
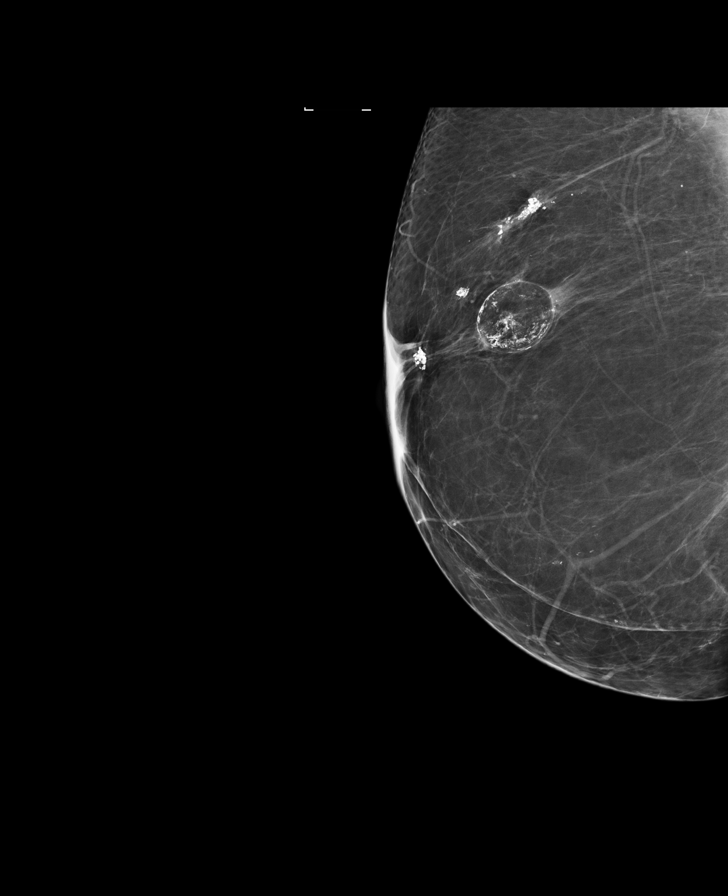

[R CC synth-2D]
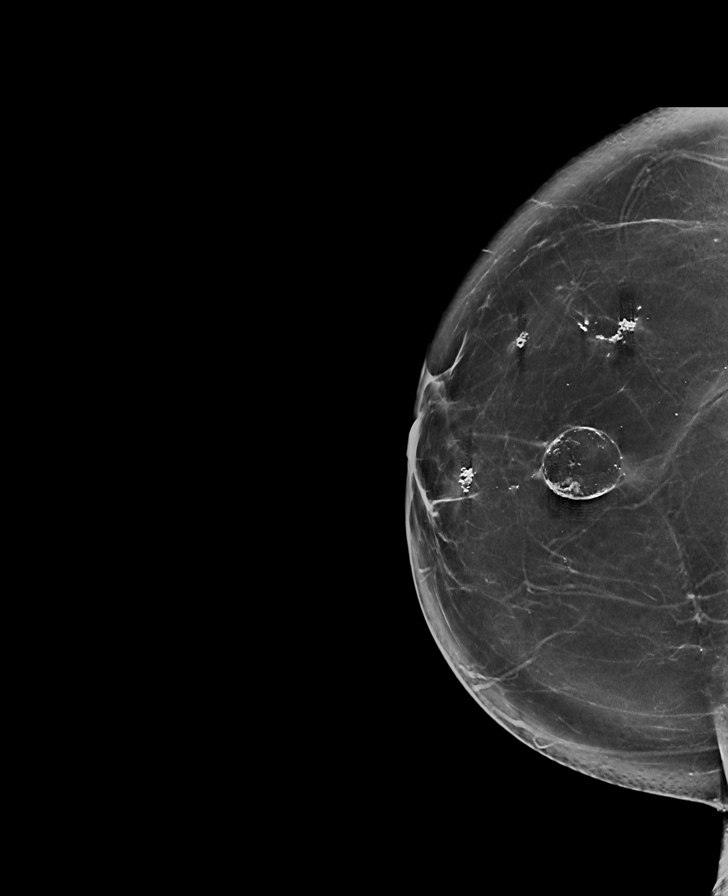

[L CC]
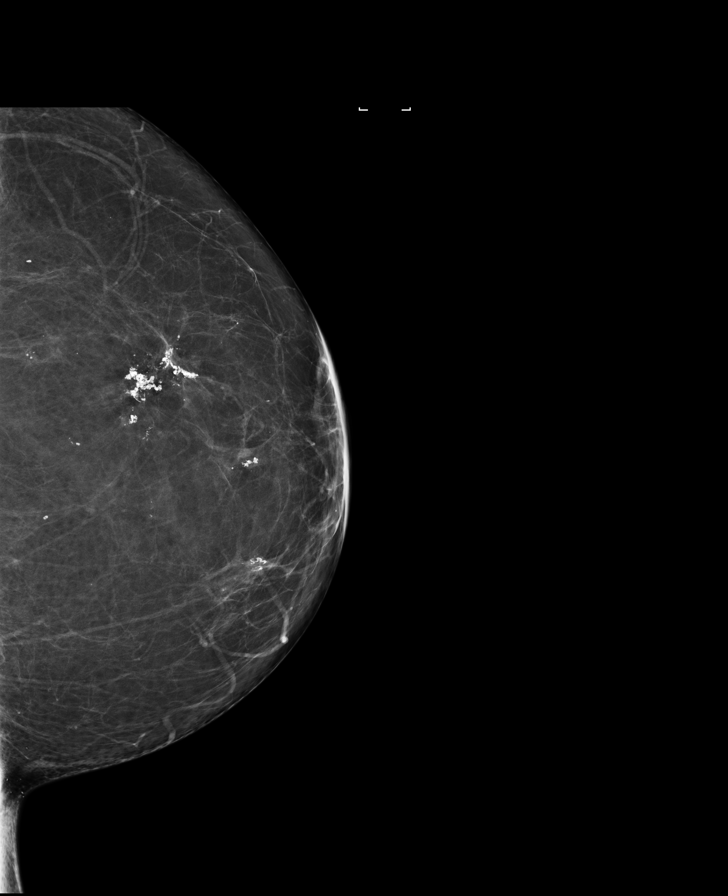

[L CC synth-2D]
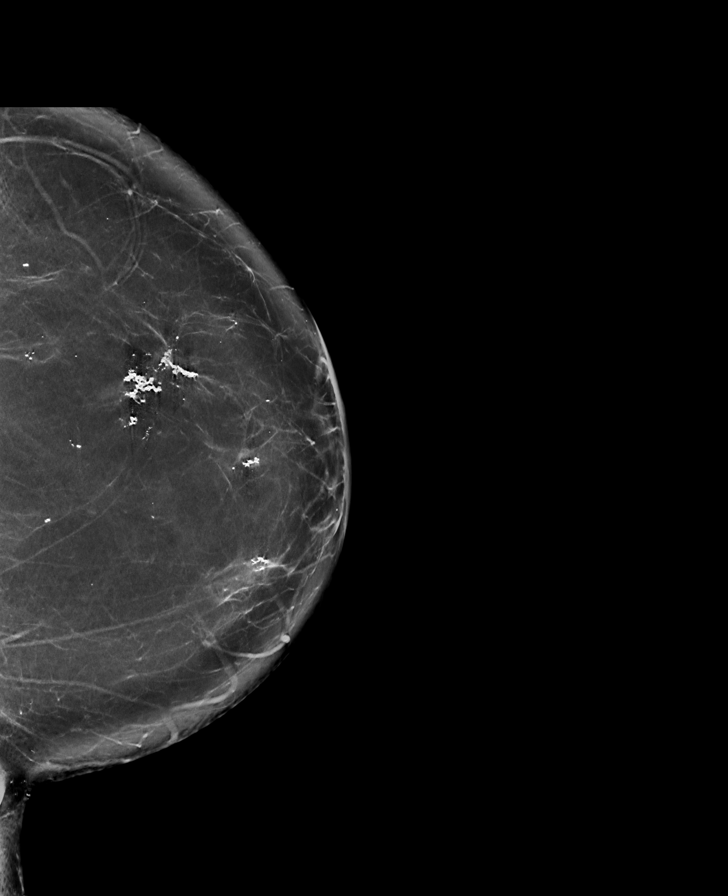

[R MLO synth-2D]
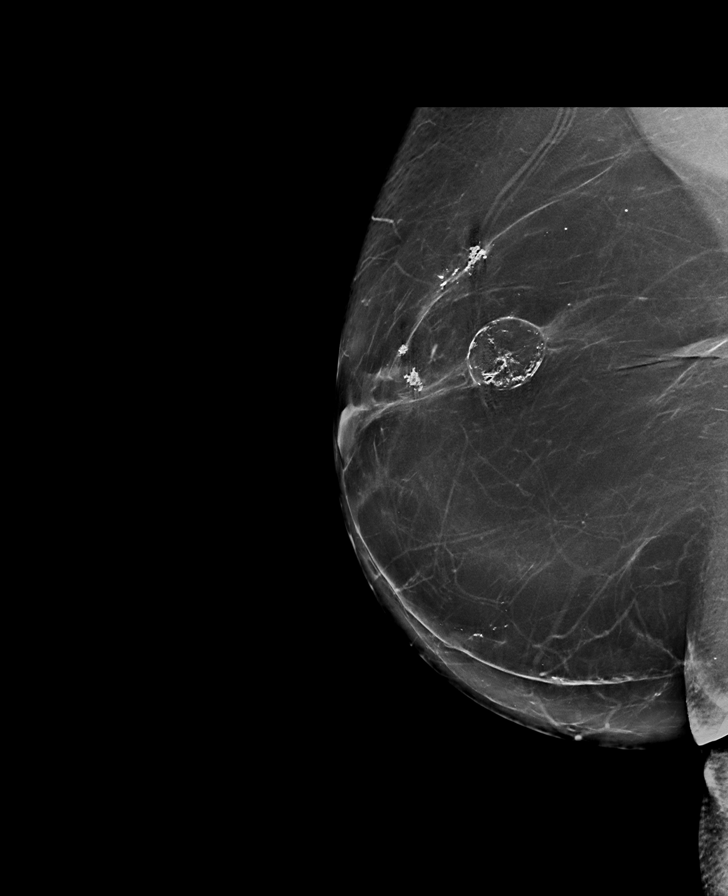

[L MLO]
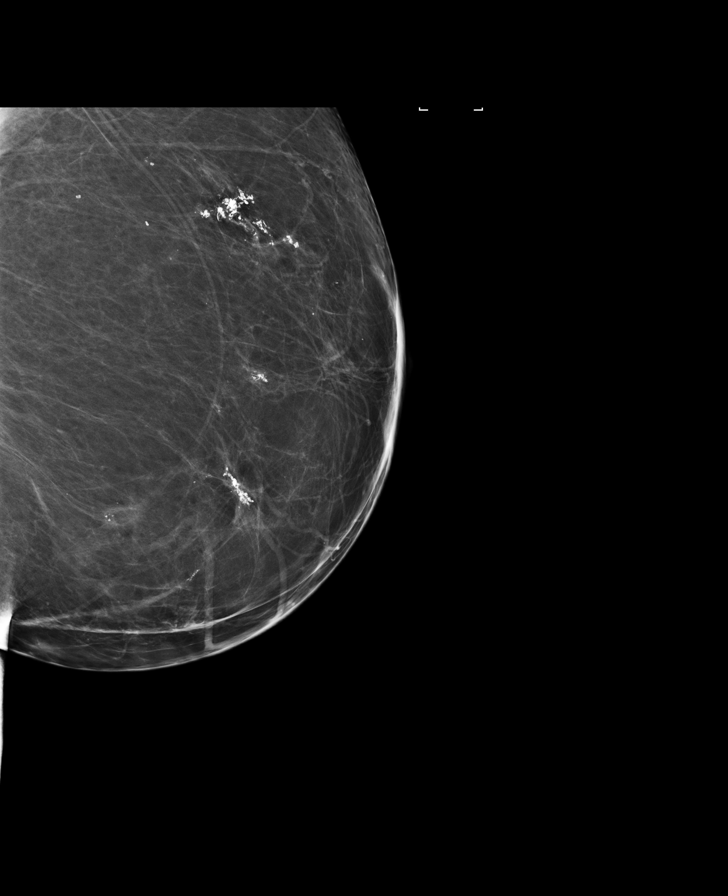

[R CC]
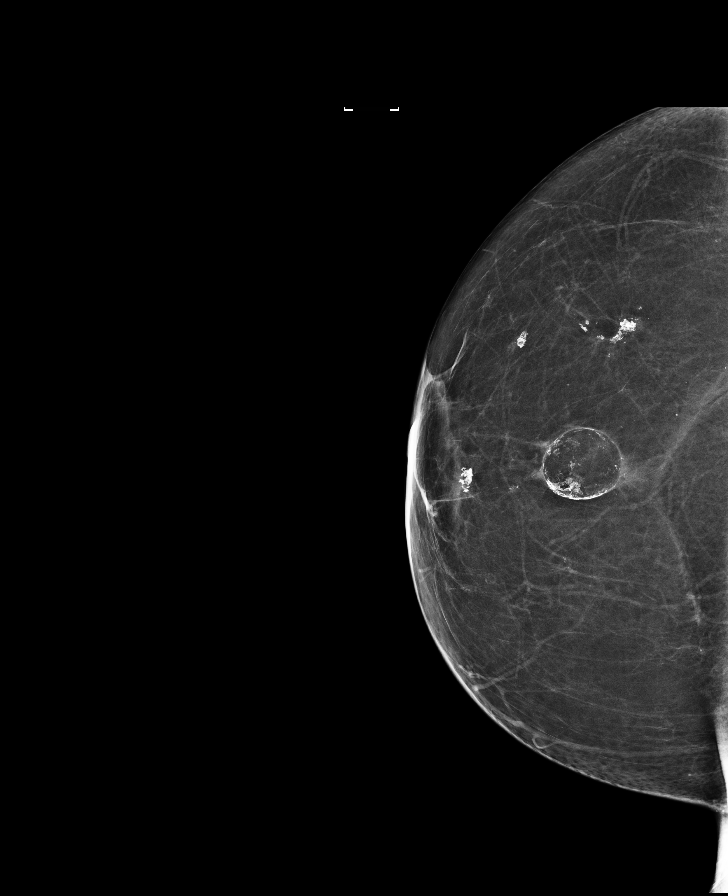

[L MLO synth-2D]
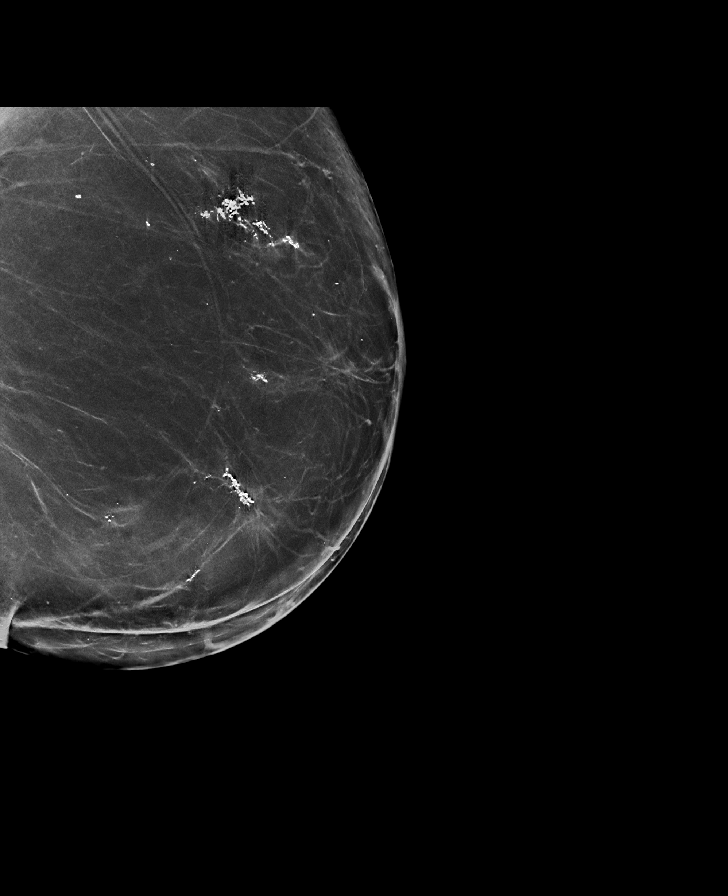

[8 of 29 positions shown; findings below may reference images not displayed]

ACR Breast Density Category b: There are scattered areas of
fibroglandular density.
FINDINGS: There are no findings suspicious for malignancy. Images were
processed with CAD.
IMPRESSION: No mammographic evidence of malignancy. A result letter of this
screening mammogram will be mailed directly to the patient.

RECOMMENDATION:
Screening mammogram in one year. (Code:55-L-23V)

BI-RADS CATEGORY  1: Negative.

## 2017-02-20 ENCOUNTER — Other Ambulatory Visit: Payer: Self-pay | Admitting: Internal Medicine

## 2017-02-20 DIAGNOSIS — R1013 Epigastric pain: Secondary | ICD-10-CM

## 2017-02-20 DIAGNOSIS — Z1231 Encounter for screening mammogram for malignant neoplasm of breast: Secondary | ICD-10-CM

## 2017-02-21 ENCOUNTER — Emergency Department
Admission: EM | Admit: 2017-02-21 | Discharge: 2017-02-21 | Disposition: A | Discharge status: Home / Self Care | Payer: Medicare Other | Attending: Student in an Organized Health Care Education/Training Program | Admitting: Student in an Organized Health Care Education/Training Program

## 2017-02-21 ENCOUNTER — Emergency Department: Payer: Medicare Other

## 2017-02-21 ENCOUNTER — Encounter: Payer: Self-pay | Admitting: Emergency Medicine

## 2017-02-21 DIAGNOSIS — Y230XXA Shotgun discharge, undetermined intent, initial encounter: Secondary | ICD-10-CM | POA: Insufficient documentation

## 2017-02-21 DIAGNOSIS — Y9389 Activity, other specified: Secondary | ICD-10-CM | POA: Insufficient documentation

## 2017-02-21 DIAGNOSIS — Y929 Unspecified place or not applicable: Secondary | ICD-10-CM | POA: Diagnosis not present

## 2017-02-21 DIAGNOSIS — Y999 Unspecified external cause status: Secondary | ICD-10-CM | POA: Insufficient documentation

## 2017-02-21 DIAGNOSIS — W3400XA Accidental discharge from unspecified firearms or gun, initial encounter: Secondary | ICD-10-CM

## 2017-02-21 DIAGNOSIS — Z79899 Other long term (current) drug therapy: Secondary | ICD-10-CM | POA: Insufficient documentation

## 2017-02-21 DIAGNOSIS — Z189 Retained foreign body fragments, unspecified material: Secondary | ICD-10-CM | POA: Insufficient documentation

## 2017-02-21 DIAGNOSIS — I10 Essential (primary) hypertension: Secondary | ICD-10-CM | POA: Insufficient documentation

## 2017-02-21 DIAGNOSIS — M795 Residual foreign body in soft tissue: Secondary | ICD-10-CM

## 2017-02-21 DIAGNOSIS — Z7982 Long term (current) use of aspirin: Secondary | ICD-10-CM | POA: Insufficient documentation

## 2017-02-21 DIAGNOSIS — S0993XA Unspecified injury of face, initial encounter: Secondary | ICD-10-CM | POA: Diagnosis present

## 2017-02-21 DIAGNOSIS — S0180XA Unspecified open wound of other part of head, initial encounter: Secondary | ICD-10-CM | POA: Diagnosis not present

## 2017-02-21 MED ORDER — TETANUS-DIPHTH-ACELL PERTUSSIS 5-2.5-18.5 LF-MCG/0.5 IM SUSP
0.5000 mL | Freq: Once | INTRAMUSCULAR | Status: AC
  Administered 2017-02-21: 0.5 mL via INTRAMUSCULAR
  Filled 2017-02-21: qty 0.5

## 2017-02-21 MED ORDER — LIDOCAINE HCL (PF) 1 % IJ SOLN
INTRAMUSCULAR | Status: DC
Start: 2017-02-21 — End: 2017-02-21
  Filled 2017-02-21: qty 10

## 2017-02-21 MED ORDER — BUPIVACAINE HCL (PF) 0.5 % IJ SOLN
30.0000 mL | Freq: Once | INTRAMUSCULAR | Status: AC
  Administered 2017-02-21: 30 mL
  Filled 2017-02-21: qty 30

## 2017-02-21 MED ORDER — BACITRACIN ZINC 500 UNIT/GM EX OINT
TOPICAL_OINTMENT | Freq: Once | CUTANEOUS | Status: AC
  Administered 2017-02-21: 1 via TOPICAL
  Filled 2017-02-21: qty 0.9
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
## 2017-02-21 NOTE — ED Notes (Signed)
Patient with small abrasion noted to left cheek. Foreign body palpated under skin of left cheek. Bleeding controlled.
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
## 2017-02-22 ENCOUNTER — Ambulatory Visit
Admission: RE | Admit: 2017-02-22 | Discharge: 2017-02-22 | Disposition: A | Discharge status: Home / Self Care | Payer: Medicare Other | Source: Ambulatory Visit | Attending: Internal Medicine | Admitting: Internal Medicine

## 2017-02-22 DIAGNOSIS — R1013 Epigastric pain: Secondary | ICD-10-CM | POA: Diagnosis not present

## 2017-05-28 ENCOUNTER — Telehealth: Payer: Self-pay | Admitting: Obstetrics and Gynecology

## 2017-05-28 NOTE — Telephone Encounter (Signed)
LM for pt with updated MyRisk results from 9/18. Initial MSH6 VUS is now of no clinical significance and report was amended. Hard copy mailed to pt. Nothing further to do at this time. Pt is still MyRisk neg, done due to [place] breast cancer.

## 2017-06-08 ENCOUNTER — Ambulatory Visit
Admission: RE | Admit: 2017-06-08 | Discharge: 2017-06-08 | Disposition: A | Discharge status: Home / Self Care | Payer: Medicare Other | Source: Ambulatory Visit | Attending: Internal Medicine | Admitting: Internal Medicine

## 2017-06-08 DIAGNOSIS — Z1231 Encounter for screening mammogram for malignant neoplasm of breast: Secondary | ICD-10-CM | POA: Diagnosis not present

## 2017-12-23 ENCOUNTER — Other Ambulatory Visit: Payer: Self-pay

## 2017-12-23 ENCOUNTER — Observation Stay
Admission: EM | Admit: 2017-12-23 | Discharge: 2017-12-25 | Disposition: A | Discharge status: Home / Self Care | Payer: Medicare Other | Attending: Internal Medicine | Admitting: Internal Medicine

## 2017-12-23 ENCOUNTER — Emergency Department: Payer: Medicare Other

## 2017-12-23 DIAGNOSIS — R930 Abnormal findings on diagnostic imaging of skull and head, not elsewhere classified: Secondary | ICD-10-CM | POA: Diagnosis not present

## 2017-12-23 DIAGNOSIS — I10 Essential (primary) hypertension: Secondary | ICD-10-CM | POA: Diagnosis present

## 2017-12-23 DIAGNOSIS — M069 Rheumatoid arthritis, unspecified: Secondary | ICD-10-CM | POA: Diagnosis not present

## 2017-12-23 DIAGNOSIS — Z79899 Other long term (current) drug therapy: Secondary | ICD-10-CM | POA: Insufficient documentation

## 2017-12-23 DIAGNOSIS — E785 Hyperlipidemia, unspecified: Secondary | ICD-10-CM | POA: Diagnosis not present

## 2017-12-23 DIAGNOSIS — R4701 Aphasia: Secondary | ICD-10-CM | POA: Diagnosis present

## 2017-12-23 DIAGNOSIS — M810 Age-related osteoporosis without current pathological fracture: Secondary | ICD-10-CM | POA: Diagnosis not present

## 2017-12-23 DIAGNOSIS — R4182 Altered mental status, unspecified: Secondary | ICD-10-CM | POA: Insufficient documentation

## 2017-12-23 DIAGNOSIS — N3000 Acute cystitis without hematuria: Secondary | ICD-10-CM | POA: Diagnosis not present

## 2017-12-23 DIAGNOSIS — K219 Gastro-esophageal reflux disease without esophagitis: Secondary | ICD-10-CM | POA: Insufficient documentation

## 2017-12-23 LAB — CBC WITH DIFFERENTIAL/PLATELET
Basophils Absolute: 0.1 10*3/uL (ref 0–0.1)
Basophils Relative: 1 %
Eosinophils Absolute: 0 10*3/uL (ref 0–0.7)
Eosinophils Relative: 1 %
HCT: 37.3 % (ref 35.0–47.0)
Hemoglobin: 13.1 g/dL (ref 12.0–16.0)
Lymphocytes Relative: 21 %
Lymphs Abs: 1.6 10*3/uL (ref 1.0–3.6)
MCH: 31.7 pg (ref 26.0–34.0)
MCHC: 35.2 g/dL (ref 32.0–36.0)
MCV: 90 fL (ref 80.0–100.0)
Monocytes Absolute: 0.6 10*3/uL (ref 0.2–0.9)
Monocytes Relative: 7 %
Neutro Abs: 5.7 10*3/uL (ref 1.4–6.5)
Neutrophils Relative %: 70 %
Platelets: 229 10*3/uL (ref 150–440)
RBC: 4.15 MIL/uL (ref 3.80–5.20)
RDW: 13.3 % (ref 11.5–14.5)
WBC: 8 10*3/uL (ref 3.6–11.0)

## 2017-12-23 LAB — COMPREHENSIVE METABOLIC PANEL
ALK PHOS: 56 U/L (ref 38–126)
ALT: 20 U/L (ref 14–54)
AST: 29 U/L (ref 15–41)
Albumin: 4.2 g/dL (ref 3.5–5.0)
Anion gap: 9 (ref 5–15)
BILIRUBIN TOTAL: 0.6 mg/dL (ref 0.3–1.2)
BUN: 16 mg/dL (ref 6–20)
CO2: 21 mmol/L — ABNORMAL LOW (ref 22–32)
Calcium: 8.9 mg/dL (ref 8.9–10.3)
Chloride: 106 mmol/L (ref 101–111)
Creatinine, Ser: 0.81 mg/dL (ref 0.44–1.00)
GFR calc Af Amer: >60 mL/min (ref >60)
GFR calc non Af Amer: >60 mL/min (ref >60)
GLUCOSE: 119 mg/dL — AB (ref 65–99)
POTASSIUM: 3.8 mmol/L (ref 3.5–5.1)
Sodium: 136 mmol/L (ref 135–145)
Total Protein: 6.9 g/dL (ref 6.5–8.1)

## 2017-12-23 LAB — URINALYSIS, COMPLETE (UACMP) WITH MICROSCOPIC
Bilirubin Urine: NEGATIVE
Glucose, UA: NEGATIVE mg/dL
Hgb urine dipstick: NEGATIVE
Ketones, ur: NEGATIVE mg/dL
Leukocytes, UA: NEGATIVE
Nitrite: NEGATIVE
Protein, ur: NEGATIVE mg/dL
Specific Gravity, Urine: 1.006 (ref 1.005–1.030)
pH: 7 (ref 5.0–8.0)

## 2017-12-23 LAB — TROPONIN I

## 2017-12-23 LAB — APTT: APTT: 29 s (ref 24–36)

## 2017-12-23 LAB — PROTIME-INR
INR: 0.9
PROTHROMBIN TIME: 12.1 s (ref 11.4–15.2)

## 2017-12-23 MED ORDER — ENOXAPARIN SODIUM 40 MG/0.4ML ~~LOC~~ SOLN
40.0000 mg | SUBCUTANEOUS | Status: DC
  Administered 2017-12-24: 40 mg via SUBCUTANEOUS
  Filled 2017-12-23: qty 0.4

## 2017-12-23 MED ORDER — ALPRAZOLAM 0.25 MG PO TABS
0.2500 mg | ORAL_TABLET | Freq: Every evening | ORAL | Status: DC | PRN
  Administered 2017-12-24: 01:00:00 0.25 mg via ORAL
  Filled 2017-12-23: qty 1

## 2017-12-23 MED ORDER — ACETAMINOPHEN 325 MG PO TABS: 650.0000 mg | ORAL_TABLET | ORAL | Status: DC | PRN

## 2017-12-23 MED ORDER — ASPIRIN 81 MG PO CHEW
324.0000 mg | CHEWABLE_TABLET | Freq: Once | ORAL | Status: AC
  Administered 2017-12-23: 324 mg via ORAL
  Filled 2017-12-23: qty 4

## 2017-12-23 MED ORDER — STROKE: EARLY STAGES OF RECOVERY BOOK
Freq: Once | Status: AC
  Administered 2017-12-23: 23:00:00

## 2017-12-23 MED ORDER — ACETAMINOPHEN 160 MG/5ML PO SOLN: 650.0000 mg | ORAL | Status: DC | PRN

## 2017-12-23 MED ORDER — PANTOPRAZOLE SODIUM 40 MG PO TBEC
40.0000 mg | DELAYED_RELEASE_TABLET | Freq: Every day | ORAL | Status: DC
  Administered 2017-12-24 – 2017-12-25 (×2): 40 mg via ORAL
  Filled 2017-12-23 (×3): qty 1

## 2017-12-23 MED ORDER — SODIUM CHLORIDE 0.9 % IV SOLN
1.0000 g | Freq: Once | INTRAVENOUS | Status: AC
  Administered 2017-12-23: 1 g via INTRAVENOUS
  Filled 2017-12-23: qty 10

## 2017-12-23 MED ORDER — ROSUVASTATIN CALCIUM 10 MG PO TABS
10.0000 mg | ORAL_TABLET | Freq: Every day | ORAL | Status: DC
  Administered 2017-12-24: 10 mg via ORAL
  Filled 2017-12-23 (×2): qty 1

## 2017-12-23 MED ORDER — ACETAMINOPHEN 650 MG RE SUPP: 650.0000 mg | RECTAL | Status: DC | PRN

## 2017-12-23 NOTE — ED Notes (Signed)
Pt's family reports another short-term episode of pt "having trouble finding the right words" when recalling a story about the pt's brother. Dr Alfred Levins to be made aware.

## 2017-12-23 NOTE — ED Triage Notes (Signed)
Pt arrives to ED POV with family. Family reports that "she'll have a moment of hot flash (pt states she feels a warm sensation coming over body) and start talking that doesn't make since." states it happens a few seconds and then will be 90's/60's and then BP will shoot up after episode is over. States about 6 times today. Symptoms began Thursday night/friday morning. States Saturday was really bad. Pt is speaking in complete sentences, clear voice. States "I was talking crazy. I didn't knew what I was saying." pt denies dizziness, weakness. Family states she'll have slurred speech and won't make sense and then go back to normal, longest episode lasted about 30 seconds, she'll stop in middle of conversation and say something totally unrelated. No trouble with speech yesterday. Family denies facial droop. States HA after episodes occur, posterior HA. Denies blood thinner use.

## 2017-12-23 NOTE — ED Notes (Signed)
Patient transported to 118

## 2017-12-23 NOTE — ED Notes (Signed)
Pt denies urinary symptoms. Denies fevers at home.   States hx migraines. Family states a few years ago she had trouble finding words with a migraine and passed out but nothing since then.

## 2017-12-23 NOTE — H&P (Signed)
[place] [facility] [place] NAME: Terry Le    MR#:  [id number]  DATE OF BIRTH:  March 25, 1951  DATE OF ADMISSION:  12/23/2017  PRIMARY CARE PHYSICIAN: Idelle Crouch, MD   REQUESTING/REFERRING PHYSICIAN: Alfred Levins, MD  CHIEF COMPLAINT:   Chief Complaint  Patient presents with  . Altered Mental Status    HISTORY OF PRESENT ILLNESS:  Bula Cavalieri  is a 67 y.o. female who presents with stuttering aphasia.  Patient states that for the past 2 days she has been having increasing frequency of short episodes of transient aphasia.  She states that she will feel these episodes coming on, but cannot describe what she feels.  She will then have her episode of difficulty getting her words out for a few minutes.  Some episodes are followed by transient headache.  Family member at bedside states that on a few episodes she is checked her blood pressure and it has been low during the aphasic.,  But then returns to normal shortly thereafter.  Initial work-up in the ED is within normal limits, hospitalist called for admission and further evaluation.  PAST MEDICAL HISTORY:   Past Medical History:  Diagnosis Date  . Ankylosing spondylitis ([place])   . Hiatal hernia   . History of peptic ulcer disease   . History of syncope 2000   w/negative work up  . Hyperlipidemia   . Hyperlipidemia   . Hypertension   . Menopausal state   . Osteoporosis   . Osteoporosis   . PUD (peptic ulcer disease)   . Syncope      PAST SURGICAL HISTORY:   Past Surgical History:  Procedure Laterality Date  . BREAST REDUCTION SURGERY    . COLONOSCOPY    . COLONOSCOPY WITH PROPOFOL N/A 04/28/2015   Procedure: COLONOSCOPY WITH PROPOFOL;  Surgeon: Manya Silvas, MD;  Location: [facility] ENDOSCOPY;  Service: Endoscopy;  Laterality: N/A;  . REDUCTION MAMMAPLASTY  2013  . TONSILLECTOMY       SOCIAL HISTORY:   Social History   Tobacco Use  . Smoking status: Never  Smoker  Substance Use Topics  . Alcohol use: No     FAMILY HISTORY:   Family History  Problem Relation Age of Onset  . Heart attack Father   . Breast cancer Maternal Aunt 45     DRUG ALLERGIES:  No Known Allergies  MEDICATIONS AT HOME:   Prior to Admission medications   Medication Sig Start Date End Date Taking? Authorizing Provider  ALPRAZolam Duanne Moron) 0.25 MG tablet Take 0.25 mg by mouth at bedtime as needed for anxiety or sleep.    Yes [provider]  losartan (COZAAR) 50 MG tablet Take 50 mg by mouth daily.   Yes [provider]  Multiple Vitamins-Minerals (PRESERVISION AREDS 2 PO) Take 1 capsule by mouth daily.    Yes [provider]  omeprazole (PRILOSEC) 40 MG capsule Take 40 mg by mouth 2 (two) times daily.   Yes [provider]  propranolol (INDERAL) 80 MG tablet Take 80 mg by mouth daily.   Yes [provider]  rosuvastatin (CRESTOR) 10 MG tablet Take 10 mg by mouth at bedtime.    Yes [provider]    REVIEW OF SYSTEMS:  Review of Systems  Constitutional: Negative for chills, fever, malaise/fatigue and weight loss.  HENT: Negative for ear pain, hearing loss and tinnitus.   Eyes: Negative for blurred vision, double vision, pain and redness.  Respiratory:  Negative for cough, hemoptysis and shortness of breath.   Cardiovascular: Negative for chest pain, palpitations, orthopnea and leg swelling.  Gastrointestinal: Negative for abdominal pain, constipation, diarrhea, nausea and vomiting.  Genitourinary: Negative for dysuria, frequency and hematuria.  Musculoskeletal: Negative for back pain, joint pain and neck pain.  Skin:       No acne, rash, or lesions  Neurological: Positive for speech change. Negative for dizziness, tremors, focal weakness and weakness.  Endo/Heme/Allergies: Negative for polydipsia. Does not bruise/bleed easily.  Psychiatric/Behavioral: Negative for depression. The patient is not  nervous/anxious and does not have insomnia.      VITAL SIGNS:   Vitals:   12/23/17 1818 12/23/17 1833 12/23/17 1947  BP: (!) 187/113 136/72 126/79  Pulse: 91 80 81  Resp: (!) 24 (!) 21 (!) 24  Temp: 98.9 F (37.2 C)    SpO2: 97% 96% 95%   Wt Readings from Last 3 Encounters:  02/21/17 72.1 kg (159 lb)  04/28/15 78.9 kg (174 lb)  03/31/15 79 kg (174 lb 4 oz)    PHYSICAL EXAMINATION:  Physical Exam  Vitals reviewed. Constitutional: She is oriented to person, place, and time. She appears well-developed and well-nourished. No distress.  HENT:  Head: Normocephalic and atraumatic.  Mouth/Throat: Oropharynx is clear and moist.  Eyes: Pupils are equal, round, and reactive to light. Conjunctivae and EOM are normal. No scleral icterus.  Neck: Normal range of motion. Neck supple. No JVD present. No thyromegaly present.  Cardiovascular: Normal rate, regular rhythm and intact distal pulses. Exam reveals no gallop and no friction rub.  No murmur heard. Respiratory: Effort normal and breath sounds normal. No respiratory distress. She has no wheezes. She has no rales.  GI: Soft. Bowel sounds are normal. She exhibits no distension. There is no tenderness.  Musculoskeletal: Normal range of motion. She exhibits no edema.  No arthritis, no gout  Lymphadenopathy:    She has no cervical adenopathy.  Neurological: She is alert and oriented to person, place, and time. No cranial nerve deficit.  Transient aphasia, no other focal neurological deficit  Skin: Skin is warm and dry. No rash noted. No erythema.  Psychiatric: She has a normal mood and affect. Her behavior is normal. Judgment and thought content normal.    LABORATORY PANEL:   CBC Recent Labs  Lab 12/23/17 1815  WBC 8.0  HGB 13.1  HCT 37.3  PLT 229   ------------------------------------------------------------------------------------------------------------------  Chemistries  Recent Labs  Lab 12/23/17 1815  NA 136  K 3.8   CL 106  CO2 21*  GLUCOSE 119*  BUN 16  CREATININE 0.81  CALCIUM 8.9  AST 29  ALT 20  ALKPHOS 56  BILITOT 0.6   ------------------------------------------------------------------------------------------------------------------  Cardiac Enzymes Recent Labs  Lab 12/23/17 1815  TROPONINI <0.03   ------------------------------------------------------------------------------------------------------------------  RADIOLOGY:  Ct Head Wo Contrast  Result Date: 12/23/2017 CLINICAL DATA:  Altered level of consciousness, migraines EXAM: CT HEAD WITHOUT CONTRAST TECHNIQUE: Contiguous axial images were obtained from the base of the skull through the vertex without intravenous contrast. COMPARISON:  06/12/2007 FINDINGS: Brain: There is atrophy and chronic small vessel disease changes. No acute intracranial abnormality. Specifically, no hemorrhage, hydrocephalus, mass lesion, acute infarction, or significant intracranial injury. Vascular: No hyperdense vessel or unexpected calcification. Skull: No acute calvarial abnormality. Sinuses/Orbits: Visualized paranasal sinuses and mastoids clear. Orbital soft tissues unremarkable. Other: None IMPRESSION: No acute intracranial abnormality. Atrophy, chronic microvascular disease. Electronically Signed   By: Rolm Baptise M.D.   On: 12/23/2017 18:55  EKG:   Orders placed or performed during the hospital encounter of 12/23/17  . EKG 12-Lead  . EKG 12-Lead  . ED EKG  . ED EKG    IMPRESSION AND PLAN:  Principal Problem:   Aphasia -stuttering symptoms as described in HPI.  Will admit per stroke admission order set with appropriate imaging, labs, consults Active Problems:   Hypertension -she is currently normotensive, hold antihypertensives for now   Hyperlipidemia -continue home meds   GERD (gastroesophageal reflux disease) -Home dose PPI  Chart review performed and case discussed with ED provider. Labs, imaging and/or ECG reviewed by provider and  discussed with patient/family. Management plans discussed with the patient and/or family.  DVT PROPHYLAXIS: SubQ lovenox  GI PROPHYLAXIS: PPI  ADMISSION STATUS: Observation  CODE STATUS: Full  TOTAL TIME TAKING CARE OF THIS PATIENT: 40 minutes.   Nycole Kawahara Lynn 12/23/2017, 9:35 PM  [facility]  Office  [phone]  CC: Primary care physician; Idelle Crouch, MD  Note:  This document was prepared using Dragon voice recognition software and may include unintentional dictation errors.

## 2017-12-23 NOTE — ED Provider Notes (Signed)
[facility] Emergency Department Provider Note  ____________________________________________  Time seen: Approximately 7:08 PM  I have reviewed the triage vital signs and the nursing notes.   HISTORY  Chief Complaint Altered Mental Status   HPI Terry Le is a 67 y.o. female with a history of rheumatoid arthritis, peptic ulcer disease, hypertension, and hyperlipidemia who presents for evaluation of altered mental status.  Since yesterday patient has been having episodes where she feels a warm sensation coming down her body.  Today she had 7 episodes like that. After she has this sensation, patient will start speaking words that make no sense. Patient reports that after she feels the warm sensation she has no recollection of what happens after. Those episodes last a few minutes and resolve without intervention.  Her blood pressure was noted by a family member to be low in the 80s during these episodes.  When the symptoms resolved she is back to speaking normally, her blood pressure is back to normal and she then has a mild occipital pressure-like headache.  The headache usually resolves after few minutes.  No seizure-like activity, no LOC.  These episodes have been witnessed by family including her daughter-in-law who is 1 of her nurses.  She denies any prior history of stroke.  She is not a smoker.  She does not drink alcohol use any drugs.  She denies any headache at this time.  She has had no facial droop, slurred speech, or unilateral weakness or numbness.  She denies chest pain, shortness of breath, URI symptoms, nausea, vomiting, diarrhea, dysuria, abdominal pain.   Past Medical History:  Diagnosis Date  . Ankylosing spondylitis ([place])   . Ankylosing spondylitis ([place])   . Hiatal hernia   . History of peptic ulcer disease   . History of syncope 2000   w/negative work up  . Hyperlipidemia   . Hyperlipidemia   . Hypertension   . Menopausal state   .  Osteoporosis   . Osteoporosis   . PUD (peptic ulcer disease)   . Syncope     Patient Active Problem List   Diagnosis Date Noted  . Hypertension 02/16/2012  . Hyperlipidemia 02/16/2012  . Chest discomfort 02/16/2012  . Hiatal hernia 02/16/2012    Past Surgical History:  Procedure Laterality Date  . BREAST REDUCTION SURGERY    . COLONOSCOPY    . COLONOSCOPY WITH PROPOFOL N/A 04/28/2015   Procedure: COLONOSCOPY WITH PROPOFOL;  Surgeon: Manya Silvas, MD;  Location: [facility] ENDOSCOPY;  Service: Endoscopy;  Laterality: N/A;  . REDUCTION MAMMAPLASTY  2013  . TONSILLECTOMY      Prior to Admission medications   Medication Sig Start Date End Date Taking? Authorizing Provider  ALPRAZolam Duanne Moron) 0.25 MG tablet Take 0.25 mg by mouth at bedtime as needed for anxiety or sleep.    Yes [provider]  dexlansoprazole (DEXILANT) 60 MG capsule Take 60 mg by mouth daily.   Yes [provider]  rosuvastatin (CRESTOR) 10 MG tablet Take 10 mg by mouth at bedtime.    Yes [provider]  losartan (COZAAR) 50 MG tablet Take 50 mg by mouth daily.    [provider]  Multiple Vitamins-Minerals (PRESERVISION AREDS 2 PO) Take by mouth daily.    [provider]  predniSONE (STERAPRED UNI-PAK 21 TAB) 10 MG (21) TBPK tablet Take 10 mg by mouth as directed.    [provider]  propranolol ER (INDERAL LA) 80 MG 24 hr capsule Take 80  mg by mouth daily.    [provider]  SUMAtriptan (IMITREX) 100 MG tablet Take 100 mg by mouth every 2 (two) hours as needed.    [provider]    Allergies Patient has no known allergies.  Family History  Problem Relation Age of Onset  . Heart attack Father   . Breast cancer Maternal Aunt 54    Social History Social History   Tobacco Use  . Smoking status: Never Smoker  Substance Use Topics  . Alcohol use: No  . Drug use: No    Review of Systems  Constitutional: Negative for fever. Eyes:  Negative for visual changes. ENT: Negative for sore throat. Neck: No neck pain  Cardiovascular: Negative for chest pain. Respiratory: Negative for shortness of breath. Gastrointestinal: Negative for abdominal pain, vomiting or diarrhea. Genitourinary: Negative for dysuria. Musculoskeletal: Negative for back pain. Skin: Negative for rash. Neurological: + headaches and abnormal speech. No weakness or numbness. Psych: No SI or HI  ____________________________________________   PHYSICAL EXAM:  VITAL SIGNS: ED Triage Vitals [12/23/17 1818]  Enc Vitals Group     BP (!) 187/113     Pulse Rate 91     Resp (!) 24     Temp 98.9 F (37.2 C)     Temp src      SpO2 97 %     Weight      Height      Head Circumference      Peak Flow      Pain Score      Pain Loc      Pain Edu?      Excl. in [place]?     Constitutional: Alert and oriented. Well appearing and in no apparent distress. HEENT:      Head: Normocephalic and atraumatic.         Eyes: Conjunctivae are normal. Sclera is non-icteric.       Mouth/Throat: Mucous membranes are moist.       Neck: Supple with no signs of meningismus.  No bruits Cardiovascular: Regular rate and rhythm. No murmurs, gallops, or rubs. 2+ symmetrical distal pulses are present in all extremities. No JVD. Respiratory: Normal respiratory effort. Lungs are clear to auscultation bilaterally. No wheezes, crackles, or rhonchi.  Gastrointestinal: Soft, non tender, and non distended with positive bowel sounds. No rebound or guarding. Musculoskeletal: Nontender with normal range of motion in all extremities. No edema, cyanosis, or erythema of extremities. Neurologic: Normal speech and language. A & O x3, PERRL, EOMI, no nystagmus, CN II-XII intact, motor testing reveals good tone and bulk throughout. There is no evidence of pronator drift or dysmetria. Muscle strength is 5/5 throughout.  Sensory examination is intact. Gait is normal. Skin: Skin is warm, dry and intact.  No rash noted. Psychiatric: Mood and affect are normal. Speech and behavior are normal.  ____________________________________________   LABS (all labs ordered are listed, but only abnormal results are displayed)  Labs Reviewed  COMPREHENSIVE METABOLIC PANEL - Abnormal; Notable for the following components:      Result Value   CO2 21 (*)    Glucose, Bld 119 (*)    All other components within normal limits  URINALYSIS, COMPLETE (UACMP) WITH MICROSCOPIC - Abnormal; Notable for the following components:   Color, Urine YELLOW (*)    APPearance HAZY (*)    Bacteria, UA MANY (*)    All other components within normal limits  URINE CULTURE  PROTIME-INR  APTT  TROPONIN I  CBC  WITH DIFFERENTIAL/PLATELET  CBG MONITORING, ED   ____________________________________________  EKG  ED ECG REPORT I, Rudene Re, the attending physician, personally viewed and interpreted this ECG.  Normal sinus rhythm, rate of 90, normal intervals, normal axis, no ST elevations or depressions.  Unchanged from prior from 2016 ____________________________________________  RADIOLOGY  I have personally reviewed the images performed during this visit and I agree with the Radiologist's read.   Interpretation by Radiologist:  Ct Head Wo Contrast  Result Date: 12/23/2017 CLINICAL DATA:  Altered level of consciousness, migraines EXAM: CT HEAD WITHOUT CONTRAST TECHNIQUE: Contiguous axial images were obtained from the base of the skull through the vertex without intravenous contrast. COMPARISON:  06/12/2007 FINDINGS: Brain: There is atrophy and chronic small vessel disease changes. No acute intracranial abnormality. Specifically, no hemorrhage, hydrocephalus, mass lesion, acute infarction, or significant intracranial injury. Vascular: No hyperdense vessel or unexpected calcification. Skull: No acute calvarial abnormality. Sinuses/Orbits: Visualized paranasal sinuses and mastoids clear. Orbital soft tissues  unremarkable. Other: None IMPRESSION: No acute intracranial abnormality. Atrophy, chronic microvascular disease. Electronically Signed   By: Rolm Baptise M.D.   On: 12/23/2017 18:55     ____________________________________________   PROCEDURES  Procedure(s) performed: None Procedures Critical Care performed:  None ____________________________________________   INITIAL IMPRESSION / ASSESSMENT AND PLAN / ED COURSE   67 y.o. female with a history of rheumatoid arthritis, peptic ulcer disease, hypertension, and hyperlipidemia who presents for evaluation of altered mental status.  Patient was 7 episodes that she describes as a warm sensation coming down her body, she does not remember or is aware of what happens afterwards, her speech makes no sense to family members, her blood pressure gets very low, and these episodes resolve without any intervention.  Patient is at this time completely neurologically intact and at baseline.  Her EKG shows no evidence of dysrhythmias or ischemia.  These episodes could definitely be TIAs however it is odd that her blood pressure becomes low during these episodes.  Will check labs to rule out anemia, dehydration, or infection leading to hypotension which could then cause demand ischemia to the brain.  Will get a head CT to rule out stroke.  Will monitor patient on telemetry.  If I am unable to find the etiology for these episodes I will plan to admit her for an MRI and TIA evaluation.    _________________________ 9:16 PM on 12/23/2017 -----------------------------------------  Patient with a mild UTI but no other acute findings on labs or CT.  She continues to have these episodes of confusion and a aphasia which lasts a few seconds and resolved without intervention.  I am concerned for possible temporal lobe TIAs causing these aura-like feeling preceding these episodes.  I have ordered an MRI of the head and neck and I will discuss with the hospitalist for admission  for further TIA evaluation.  Patient will also be given Rocephin for her UTI and aspirin for stroke prophylaxis.   As part of my medical decision making, I reviewed the following data within the [place] History obtained from family, Nursing notes reviewed and incorporated, Labs reviewed , EKG interpreted , Old chart reviewed, Radiograph reviewed , Discussed with admitting physician , Notes from prior ED visits and [place] Controlled Substance Database    Pertinent labs & imaging results that were available during my care of the patient were reviewed by me and considered in my medical decision making (see chart for details).    ____________________________________________   FINAL CLINICAL IMPRESSION(S) /  ED DIAGNOSES  Final diagnoses:  Aphasia  Altered mental status, unspecified altered mental status type  Acute cystitis without hematuria      NEW MEDICATIONS STARTED DURING THIS VISIT:  ED Discharge Orders    None       Note:  This document was prepared using Dragon voice recognition software and may include unintentional dictation errors.    Rudene Re, MD 12/23/17 2117

## 2017-12-23 NOTE — ED Notes (Signed)
Pt returned from CT °

## 2017-12-23 NOTE — ED Notes (Signed)
Pt taken to CT via stretcher, family remains at bedside.

## 2017-12-24 ENCOUNTER — Observation Stay: Payer: Medicare Other

## 2017-12-24 ENCOUNTER — Observation Stay (HOSPITAL_BASED_OUTPATIENT_CLINIC_OR_DEPARTMENT_OTHER): Payer: Medicare Other

## 2017-12-24 DIAGNOSIS — R4701 Aphasia: Secondary | ICD-10-CM | POA: Diagnosis not present

## 2017-12-24 LAB — HEMOGLOBIN A1C
HEMOGLOBIN A1C: 5.5 % (ref 4.8–5.6)
Mean Plasma Glucose: 111.15 mg/dL

## 2017-12-24 LAB — LIPID PANEL
Cholesterol: 161 mg/dL (ref 0–200)
HDL: 78 mg/dL (ref >40)
LDL CALC: 65 mg/dL (ref 0–99)
TRIGLYCERIDES: 88 mg/dL (ref <150)
Total CHOL/HDL Ratio: 2.1 RATIO
VLDL: 18 mg/dL (ref 0–40)

## 2017-12-24 LAB — SEDIMENTATION RATE: Sed Rate: 18 mm/hr (ref 0–30)

## 2017-12-24 LAB — TSH: TSH: 0.805 u[IU]/mL (ref 0.350–4.500)

## 2017-12-24 MED ORDER — GADOBENATE DIMEGLUMINE 529 MG/ML IV SOLN
15.0000 mL | Freq: Once | INTRAVENOUS | Status: AC | PRN
  Administered 2017-12-24: 02:00:00 15 mL via INTRAVENOUS

## 2017-12-24 MED ORDER — SODIUM CHLORIDE 0.9 % IV SOLN
1.0000 g | INTRAVENOUS | Status: DC
  Administered 2017-12-24 – 2017-12-25 (×2): 1 g via INTRAVENOUS
  Filled 2017-12-24: qty 10
  Filled 2017-12-24: qty 1

## 2017-12-24 NOTE — Progress Notes (Signed)
SLP Cancellation Note  Patient Details Name: Terry Le MRN: [id number] DOB: May 17, 1951   Cancelled treatment:       Reason Eval/Treat Not Completed: SLP screened, no needs identified, will sign off(chart reviewed; consulted NSG then met w/ pt/family). Pt denied any difficulty swallowing and is currently on a regular diet(eating chick fil-a w/ family in room) ; tolerates swallowing pills w/ water per NSG. Pt conversed at conversational level w/ family and SLP w/out deficits noted; pt and family denied any speech-language deficits currently - stated this happens when "I have these spells". Encouraged pt to f/u w/ her Neurologist/MD for further information post discharge. No further skilled ST services indicated as pt appears at her baseline. Pt agreed. NSG to reconsult if any change in status.    Orinda Kenner, MS, CCC-SLP Watson,Katherine 12/24/2017, 1:06 PM

## 2017-12-24 NOTE — Care Management Note (Signed)
Case Management Note  Patient Details  Name: Terry Le MRN: [id number] Date of Birth: 14-Dec-1950  Subjective/Objective: Admitted to [facility] under observation status with the diagnosis of aphasia. Lives with husband, Camila Li [phone]). Sees Dr. Doy Hutching every 6 months. Prescriptions are filled at [facility] on [address]. No home Health. No skilled facility. No home oxygen. No medical equipment in the home for [name]. Takes care of all basic and instrumental activities of daily living herself, drives. No falls. Good appetite. Family will transport                   Action/Plan: No discharge needs identified at this time  Expected Discharge Date:                  Expected Discharge Plan:     In-House Referral:     Discharge planning Services     Post Acute Care Choice:    Choice offered to:     DME Arranged:    DME Agency:     HH Arranged:    HH Agency:     Status of Service:     If discussed at [employer], dates discussed:    Additional Comments:  Shelbie Ammons, RN MSN CCM Care Management [phone] 12/24/2017, 9:13 AM

## 2017-12-24 NOTE — Care Management Obs Status (Signed)
[facility] NOTIFICATION   Patient Details  Name: Terry Le MRN: [id number] Date of Birth: 1950-08-20   Medicare Observation Status Notification Given:  Yes    Shelbie Ammons, RN 12/24/2017, 8:57 AM

## 2017-12-24 NOTE — Evaluation (Signed)
Physical Therapy Evaluation Patient Details Name: Terry Le MRN: [id number] DOB: 11/18/1950 Today's Date: 12/24/2017   History of Present Illness  Patient is a 67 year old female admitted with expressive apahasia. Stroke workup negative. Pending EEG.  Clinical Impression  Patient evaluated for PT needs, she is able to ambulate without AD 160 feet, performs transfers and bed mobility independently. Patient without LOB or difficulty with mobility this visit. Patient is safe to return home with assistance of husband as needed.       Follow Up Recommendations No PT follow up    Equipment Recommendations       Recommendations for Other Services       Precautions / Restrictions Precautions Precautions: None Restrictions Weight Bearing Restrictions: No      Mobility  Bed Mobility Overal bed mobility: Independent                Transfers Overall transfer level: Independent Equipment used: None                Ambulation/Gait Ambulation/Gait assistance: Independent;Supervision Ambulation Distance (Feet): 160 Feet Assistive device: None       General Gait Details: Patient ambulates with normal cadence and no lob   Stairs            Wheelchair Mobility    Modified Rankin (Stroke Patients Only)       Balance Overall balance assessment: Independent                                           Pertinent Vitals/Pain Pain Assessment: No/denies pain    Home Living Family/patient expects to be discharged to:: Private residence Living Arrangements: Spouse/significant other Available Help at Discharge: Family Type of Home: House         Home Equipment: None      Prior Function Level of Independence: Independent         Comments: Denies fall history, drives     Hand Dominance        Extremity/Trunk Assessment   Upper Extremity Assessment Upper Extremity Assessment: Overall WFL for tasks assessed    Lower  Extremity Assessment Lower Extremity Assessment: Overall WFL for tasks assessed    Cervical / Trunk Assessment Cervical / Trunk Assessment: Normal  Communication   Communication: No difficulties  Cognition Arousal/Alertness: Awake/alert Behavior During Therapy: WFL for tasks assessed/performed Overall Cognitive Status: Within Functional Limits for tasks assessed                                        General Comments      Exercises     Assessment/Plan    PT Assessment Patent does not need any further PT services  PT Problem List         PT Treatment Interventions      PT Goals (Current goals can be found in the Care Plan section)  Acute Rehab PT Goals Patient Stated Goal: Patient would like to go home PT Goal Formulation: All assessment and education complete, DC therapy    Frequency     Barriers to discharge        Co-evaluation               AM-PAC PT "6 Clicks" Daily Activity  Outcome Measure Difficulty turning  over in bed (including adjusting bedclothes, sheets and blankets)?: None Difficulty moving from lying on back to sitting on the side of the bed? : None Difficulty sitting down on and standing up from a chair with arms (e.g., wheelchair, bedside commode, etc,.)?: None Help needed moving to and from a bed to chair (including a wheelchair)?: None Help needed walking in hospital room?: None Help needed climbing 3-5 steps with a railing? : None 6 Click Score: 24    End of Session Equipment Utilized During Treatment: Gait belt Activity Tolerance: Patient tolerated treatment well Patient left: in chair;with family/visitor present        Time: 1140-1155 PT Time Calculation (min) (ACUTE ONLY): 15 min   Charges:   PT Evaluation $PT Eval Low Complexity: 1 Low     PT G Codes:         Martine Bleecker, PT, GCS 12/24/17,12:08 PM

## 2017-12-24 NOTE — Progress Notes (Addendum)
OT Screen Note  Patient Details Name: Terry Le MRN: [id number] DOB: 1951-07-28   Cancelled Treatment:    Reason Eval/Treat Not Completed: OT screened, no needs identified, will sign off. On 2nd attempt, pt screened. Pt back to baseline independence, symptoms resolved. No skilled OT needs identified. Will sign off. Please re-consult if additional needs arise.   Jeni Salles, MPH, MS, OTR/L ascom [phone] 12/24/17, 3:52 PM

## 2017-12-24 NOTE — Progress Notes (Signed)
OT Cancellation Note  Patient Details Name: JENIA KLEPPER MRN: [id number] DOB: Jun 27, 1951   Cancelled Treatment:    Reason Eval/Treat Not Completed: Patient at procedure or test/ unavailable. Order received, chart reviewed. Pt undergoing EEG upon attempt. Will re-attempt OT evaluation at later date/time as pt is available and medically appropriate.   Jeni Salles, MPH, MS, OTR/L ascom [phone] 12/24/17, 2:32 PM

## 2017-12-24 NOTE — Consult Note (Signed)
Reason for Consult:Aphasia Referring Physician: Sudini  CC: Aphasia  HPI: Terry Le is an 67 y.o. female with a history of aphasic episodes previously diagnosed as migraine.  Has done well for some time.  Over the past few days has had multiple episodes of having a warm sensation come over her then have difficulty getting her words out.  Patient appears to be able to remember all episodes.  Reportedly they last no more than 30 seconds.  There is no associated incontinence.  They are very similar to her previous events.  BP taken during these episodes is decreased.    Past Medical History:  Diagnosis Date  . Ankylosing spondylitis ([place])   . Hiatal hernia   . History of peptic ulcer disease   . History of syncope 2000   w/negative work up  . Hyperlipidemia   . Hyperlipidemia   . Hypertension   . Menopausal state   . Osteoporosis   . Osteoporosis   . PUD (peptic ulcer disease)   . Syncope     Past Surgical History:  Procedure Laterality Date  . BREAST REDUCTION SURGERY    . COLONOSCOPY    . COLONOSCOPY WITH PROPOFOL N/A 04/28/2015   Procedure: COLONOSCOPY WITH PROPOFOL;  Surgeon: Manya Silvas, MD;  Location: [facility] ENDOSCOPY;  Service: Endoscopy;  Laterality: N/A;  . REDUCTION MAMMAPLASTY  2013  . TONSILLECTOMY      Family History  Problem Relation Age of Onset  . Heart attack Father   . Breast cancer Maternal Aunt 16    Social History:  reports that she has never smoked. She has never used smokeless tobacco. She reports that she does not drink alcohol or use drugs.  No Known Allergies  Medications:  I have reviewed the patient's current medications. Prior to Admission:  Medications Prior to Admission  Medication Sig Dispense Refill Last Dose  . ALPRAZolam (XANAX) 0.25 MG tablet Take 0.25 mg by mouth at bedtime as needed for anxiety or sleep.    12/22/2017 at prn  . losartan (COZAAR) 50 MG tablet Take 50 mg by mouth daily.   12/23/2017 at am  . Multiple  Vitamins-Minerals (PRESERVISION AREDS 2 PO) Take 1 capsule by mouth daily.    12/23/2017 at am  . omeprazole (PRILOSEC) 40 MG capsule Take 40 mg by mouth 2 (two) times daily.   12/23/2017 at am  . propranolol (INDERAL) 80 MG tablet Take 80 mg by mouth daily.   12/23/2017 at am  . rosuvastatin (CRESTOR) 10 MG tablet Take 10 mg by mouth at bedtime.    12/22/2017 at Unknown time   Scheduled: . enoxaparin (LOVENOX) injection  40 mg Subcutaneous Q24H  . pantoprazole  40 mg Oral Daily  . rosuvastatin  10 mg Oral QHS    ROS: History obtained from the patient  General ROS: negative for - chills, fatigue, fever, night sweats, weight gain or weight loss Psychological ROS: negative for - behavioral disorder, hallucinations, memory difficulties, mood swings or suicidal ideation Ophthalmic ROS: negative for - blurry vision, double vision, eye pain or loss of vision ENT ROS: negative for - epistaxis, nasal discharge, oral lesions, sore throat, tinnitus or vertigo Allergy and Immunology ROS: negative for - hives or itchy/watery eyes Hematological and Lymphatic ROS: negative for - bleeding problems, bruising or swollen lymph nodes Endocrine ROS: negative for - galactorrhea, hair pattern changes, polydipsia/polyuria or temperature intolerance Respiratory ROS: negative for - cough, hemoptysis, shortness of breath or wheezing Cardiovascular ROS: negative for -  chest pain, dyspnea on exertion, edema or irregular heartbeat Gastrointestinal ROS: negative for - abdominal pain, diarrhea, hematemesis, nausea/vomiting or stool incontinence Genito-Urinary ROS: negative for - dysuria, hematuria, incontinence or urinary frequency/urgency Musculoskeletal ROS: negative for - joint swelling or muscular weakness Neurological ROS: as noted in HPI Dermatological ROS: negative for rash and skin lesion changes  Physical Examination: Blood pressure 121/77, pulse 87, temperature 98.3 F (36.8 C), temperature source Oral, resp.  rate 18, height 4' 10"  (1.473 m), weight 73.1 kg (161 lb 3.2 oz), SpO2 98 %.  HEENT-  Normocephalic, no lesions, without obvious abnormality.  Normal external eye and conjunctiva.  Normal TM's bilaterally.  Normal auditory canals and external ears. Normal external nose, mucus membranes and septum.  Normal pharynx. Cardiovascular- S1, S2 normal, pulses palpable throughout   Lungs- chest clear, no wheezing, rales, normal symmetric air entry Abdomen- soft, non-tender; bowel sounds normal; no masses,  no organomegaly Extremities- no edema Lymph-no adenopathy palpable Musculoskeletal-no joint tenderness, deformity or swelling Skin-warm and dry, no hyperpigmentation, vitiligo, or suspicious lesions  Neurological Examination   Mental Status: Alert, oriented, thought content appropriate.  Speech fluent without evidence of aphasia.  Able to follow 3 step commands without difficulty. Cranial Nerves: II: Discs flat bilaterally; Visual fields grossly normal, pupils equal, round, reactive to light and accommodation III,IV, VI: ptosis not present, extra-ocular motions intact bilaterally V,VII: smile symmetric, facial light touch sensation normal bilaterally VIII: hearing normal bilaterally IX,X: gag reflex present XI: bilateral shoulder shrug XII: midline tongue extension Motor: Right : Upper extremity   5/5    Left:     Upper extremity   5/5  Lower extremity   5/5     Lower extremity   5/5 Tone and bulk:normal tone throughout; no atrophy noted Sensory: Pinprick and light touch intact throughout, bilaterally Deep Tendon Reflexes: 2+ and symmetric with absent AJ's bilaterally Plantars: Right: downgoing   Left: downgoing Cerebellar: Normal finger-to-nose, normal rapid alternating movements and normal heel-to-shin testing bilaterally Gait: not tested due to safety concerns    Laboratory Studies:   Basic Metabolic Panel: Recent Labs  Lab 12/23/17 1815  NA 136  K 3.8  CL 106  CO2 21*   GLUCOSE 119*  BUN 16  CREATININE 0.81  CALCIUM 8.9    Liver Function Tests: Recent Labs  Lab 12/23/17 1815  AST 29  ALT 20  ALKPHOS 56  BILITOT 0.6  PROT 6.9  ALBUMIN 4.2   No results for input(s): LIPASE, AMYLASE in the last 168 hours. No results for input(s): AMMONIA in the last 168 hours.  CBC: Recent Labs  Lab 12/23/17 1815  WBC 8.0  NEUTROABS 5.7  HGB 13.1  HCT 37.3  MCV 90.0  PLT 229    Cardiac Enzymes: Recent Labs  Lab 12/23/17 1815  TROPONINI <0.03    BNP: Invalid input(s): POCBNP  CBG: No results for input(s): GLUCAP in the last 168 hours.  Microbiology: No results found for this or any previous visit.  Coagulation Studies: Recent Labs    12/23/17 1815  LABPROT 12.1  INR 0.90    Urinalysis:  Recent Labs  Lab 12/23/17 2034  COLORURINE YELLOW*  LABSPEC 1.006  PHURINE 7.0  GLUCOSEU NEGATIVE  HGBUR NEGATIVE  BILIRUBINUR NEGATIVE  KETONESUR NEGATIVE  PROTEINUR NEGATIVE  NITRITE NEGATIVE  LEUKOCYTESUR NEGATIVE    Lipid Panel:     Component Value Date/Time   CHOL 161 12/24/2017 0425   TRIG 88 12/24/2017 0425   HDL 78 12/24/2017 0425  CHOLHDL 2.1 12/24/2017 0425   VLDL 18 12/24/2017 0425   LDLCALC 65 12/24/2017 0425    HgbA1C:  Lab Results  Component Value Date   HGBA1C 5.5 12/24/2017    Urine Drug Screen:  No results found for: LABOPIA, COCAINSCRNUR, LABBENZ, AMPHETMU, THCU, LABBARB  Alcohol Level: No results for input(s): ETH in the last 168 hours.  Other results: EKG: sinus rhythm at 90 bpm.  Imaging: Ct Head Wo Contrast  Result Date: 12/23/2017 CLINICAL DATA:  Altered level of consciousness, migraines EXAM: CT HEAD WITHOUT CONTRAST TECHNIQUE: Contiguous axial images were obtained from the base of the skull through the vertex without intravenous contrast. COMPARISON:  06/12/2007 FINDINGS: Brain: There is atrophy and chronic small vessel disease changes. No acute intracranial abnormality. Specifically, no  hemorrhage, hydrocephalus, mass lesion, acute infarction, or significant intracranial injury. Vascular: No hyperdense vessel or unexpected calcification. Skull: No acute calvarial abnormality. Sinuses/Orbits: Visualized paranasal sinuses and mastoids clear. Orbital soft tissues unremarkable. Other: None IMPRESSION: No acute intracranial abnormality. Atrophy, chronic microvascular disease. Electronically Signed   By: Rolm Baptise M.D.   On: 12/23/2017 18:55   Mr Jodene Nam Head Wo Contrast  Result Date: 12/24/2017 CLINICAL DATA:  Initial evaluation for acute altered mental status. EXAM: MRI HEAD WITHOUT CONTRAST MRA HEAD WITHOUT CONTRAST MRA NECK WITHOUT AND WITH CONTRAST TECHNIQUE: Multiplanar, multiecho pulse sequences of the brain and surrounding structures were obtained without intravenous contrast. Angiographic images of the Circle of Willis were obtained using MRA technique without intravenous contrast. Angiographic images of the neck were obtained using MRA technique without and with intravenous contrast. Carotid stenosis measurements (when applicable) are obtained utilizing NASCET criteria, using the distal internal carotid diameter as the denominator. CONTRAST:  59m MULTIHANCE GADOBENATE DIMEGLUMINE 529 MG/ML IV SOLN COMPARISON:  Prior CT from 12/23/2017 FINDINGS: MRI HEAD FINDINGS Generalized age related cerebral volume loss. Patchy and confluent T2/FLAIR hyperintensity within the periventricular, deep, and subcortical white matter of both cerebral hemispheres, nonspecific, but most likely related chronic small vessel ischemic change. Overall, appearance is moderate in nature. No abnormal foci of restricted diffusion to suggest acute or subacute ischemia. Gray-white matter differentiation maintained. No encephalomalacia to suggest chronic infarction. No foci of susceptibility artifact to suggest acute or chronic intracranial hemorrhage. No mass lesion, midline shift or mass effect. No hydrocephalus. No  extra-axial fluid collection. Major dural sinuses grossly patent. Pituitary gland suprasellar region normal. Midline structures intact and normal. Major intracranial vascular flow voids are maintained. Craniocervical junction within normal limits. Upper cervical spine demonstrates no acute abnormality. Mild degenerate spondylolysis noted at C4-5 with resultant mild spinal stenosis. Bone marrow signal intensity within normal limits. No scalp soft tissue abnormality. Globes and orbital soft tissues within normal limits. Mild scattered mucosal thickening within the ethmoidal air cells. Paranasal sinuses are otherwise clear. No mastoid effusion. Inner ear structures normal. MRA HEAD FINDINGS ANTERIOR CIRCULATION: Distal cervical segments of the internal carotid arteries are patent with antegrade flow. Petrous, cavernous, and supraclinoid segments patent bilaterally without stenosis. ICA termini widely patent. A1 segments patent bilaterally. Normal anterior communicating artery. Anterior cerebral arteries widely patent to their distal aspects without stenosis. No M1 stenosis or occlusion. No proximal M2 occlusion. Distal MCA branches well perfused and symmetric. POSTERIOR CIRCULATION: Vertebral arteries patent to the vertebrobasilar junction without stenosis. Posterior inferior cerebral arteries patent proximally. Basilar artery widely patent to its distal aspect without stenosis. Superior cerebral arteries patent bilaterally. Both of the posterior cerebral artery supplied via the basilar and are widely patent to their  distal aspects without stenosis. No aneurysm. MRA NECK FINDINGS Source images reviewed. Visualized aortic arch of normal caliber with normal branch pattern. No flow-limiting stenosis about the origin of the great vessels. 0 parent right subclavian artery noted. Visualized subclavian arteries patent without flow-limiting stenosis Right common carotid artery tortuous proximally but widely patent to the  bifurcation without stenosis. No significant atheromatous narrowing about the right bifurcation. Right ICA tortuous but widely patent to the skull base without stenosis or occlusion. Left common carotid artery tortuous proximally but widely patent to the bifurcation without stenosis. No significant atheromatous narrowing about the left bifurcation. Left ICA tortuous but widely patent to the skull base without stenosis or occlusion. Both of the vertebral arteries arise from the subclavian arteries. Vertebral arteries tortuous bilaterally. Left vertebral artery slightly dominant. Vertebral arteries patent within the neck without stenosis or occlusion. IMPRESSION: MRI HEAD IMPRESSION: 1. No acute intracranial abnormality identified. 2. Patchy T2/FLAIR hyperintensities involving the supratentorial cerebral white matter, nonspecific, but most likely related to chronic small vessel ischemic change, moderate in nature. MRA HEAD IMPRESSION: Normal MRA of the neck. No large vessel occlusion. No high-grade or correctable stenosis. MRA NECK IMPRESSION: 1. Negative MRA of the neck with no high-grade or critical flow limiting stenosis. 2. Diffuse tortuosity involving the major arterial vasculature of the neck, suggesting chronic underlying hypertension. Electronically Signed   By: Jeannine Boga M.D.   On: 12/24/2017 04:26   Mr Angiogram Neck W Or Wo Contrast  Result Date: 12/24/2017 CLINICAL DATA:  Initial evaluation for acute altered mental status. EXAM: MRI HEAD WITHOUT CONTRAST MRA HEAD WITHOUT CONTRAST MRA NECK WITHOUT AND WITH CONTRAST TECHNIQUE: Multiplanar, multiecho pulse sequences of the brain and surrounding structures were obtained without intravenous contrast. Angiographic images of the Circle of Willis were obtained using MRA technique without intravenous contrast. Angiographic images of the neck were obtained using MRA technique without and with intravenous contrast. Carotid stenosis measurements (when  applicable) are obtained utilizing NASCET criteria, using the distal internal carotid diameter as the denominator. CONTRAST:  34m MULTIHANCE GADOBENATE DIMEGLUMINE 529 MG/ML IV SOLN COMPARISON:  Prior CT from 12/23/2017 FINDINGS: MRI HEAD FINDINGS Generalized age related cerebral volume loss. Patchy and confluent T2/FLAIR hyperintensity within the periventricular, deep, and subcortical white matter of both cerebral hemispheres, nonspecific, but most likely related chronic small vessel ischemic change. Overall, appearance is moderate in nature. No abnormal foci of restricted diffusion to suggest acute or subacute ischemia. Gray-white matter differentiation maintained. No encephalomalacia to suggest chronic infarction. No foci of susceptibility artifact to suggest acute or chronic intracranial hemorrhage. No mass lesion, midline shift or mass effect. No hydrocephalus. No extra-axial fluid collection. Major dural sinuses grossly patent. Pituitary gland suprasellar region normal. Midline structures intact and normal. Major intracranial vascular flow voids are maintained. Craniocervical junction within normal limits. Upper cervical spine demonstrates no acute abnormality. Mild degenerate spondylolysis noted at C4-5 with resultant mild spinal stenosis. Bone marrow signal intensity within normal limits. No scalp soft tissue abnormality. Globes and orbital soft tissues within normal limits. Mild scattered mucosal thickening within the ethmoidal air cells. Paranasal sinuses are otherwise clear. No mastoid effusion. Inner ear structures normal. MRA HEAD FINDINGS ANTERIOR CIRCULATION: Distal cervical segments of the internal carotid arteries are patent with antegrade flow. Petrous, cavernous, and supraclinoid segments patent bilaterally without stenosis. ICA termini widely patent. A1 segments patent bilaterally. Normal anterior communicating artery. Anterior cerebral arteries widely patent to their distal aspects without  stenosis. No M1 stenosis or occlusion. No proximal M2  occlusion. Distal MCA branches well perfused and symmetric. POSTERIOR CIRCULATION: Vertebral arteries patent to the vertebrobasilar junction without stenosis. Posterior inferior cerebral arteries patent proximally. Basilar artery widely patent to its distal aspect without stenosis. Superior cerebral arteries patent bilaterally. Both of the posterior cerebral artery supplied via the basilar and are widely patent to their distal aspects without stenosis. No aneurysm. MRA NECK FINDINGS Source images reviewed. Visualized aortic arch of normal caliber with normal branch pattern. No flow-limiting stenosis about the origin of the great vessels. 0 parent right subclavian artery noted. Visualized subclavian arteries patent without flow-limiting stenosis Right common carotid artery tortuous proximally but widely patent to the bifurcation without stenosis. No significant atheromatous narrowing about the right bifurcation. Right ICA tortuous but widely patent to the skull base without stenosis or occlusion. Left common carotid artery tortuous proximally but widely patent to the bifurcation without stenosis. No significant atheromatous narrowing about the left bifurcation. Left ICA tortuous but widely patent to the skull base without stenosis or occlusion. Both of the vertebral arteries arise from the subclavian arteries. Vertebral arteries tortuous bilaterally. Left vertebral artery slightly dominant. Vertebral arteries patent within the neck without stenosis or occlusion. IMPRESSION: MRI HEAD IMPRESSION: 1. No acute intracranial abnormality identified. 2. Patchy T2/FLAIR hyperintensities involving the supratentorial cerebral white matter, nonspecific, but most likely related to chronic small vessel ischemic change, moderate in nature. MRA HEAD IMPRESSION: Normal MRA of the neck. No large vessel occlusion. No high-grade or correctable stenosis. MRA NECK IMPRESSION: 1.  Negative MRA of the neck with no high-grade or critical flow limiting stenosis. 2. Diffuse tortuosity involving the major arterial vasculature of the neck, suggesting chronic underlying hypertension. Electronically Signed   By: Jeannine Boga M.D.   On: 12/24/2017 04:26   Mr Brain Wo Contrast  Result Date: 12/24/2017 CLINICAL DATA:  Initial evaluation for acute altered mental status. EXAM: MRI HEAD WITHOUT CONTRAST MRA HEAD WITHOUT CONTRAST MRA NECK WITHOUT AND WITH CONTRAST TECHNIQUE: Multiplanar, multiecho pulse sequences of the brain and surrounding structures were obtained without intravenous contrast. Angiographic images of the Circle of Willis were obtained using MRA technique without intravenous contrast. Angiographic images of the neck were obtained using MRA technique without and with intravenous contrast. Carotid stenosis measurements (when applicable) are obtained utilizing NASCET criteria, using the distal internal carotid diameter as the denominator. CONTRAST:  69m MULTIHANCE GADOBENATE DIMEGLUMINE 529 MG/ML IV SOLN COMPARISON:  Prior CT from 12/23/2017 FINDINGS: MRI HEAD FINDINGS Generalized age related cerebral volume loss. Patchy and confluent T2/FLAIR hyperintensity within the periventricular, deep, and subcortical white matter of both cerebral hemispheres, nonspecific, but most likely related chronic small vessel ischemic change. Overall, appearance is moderate in nature. No abnormal foci of restricted diffusion to suggest acute or subacute ischemia. Gray-white matter differentiation maintained. No encephalomalacia to suggest chronic infarction. No foci of susceptibility artifact to suggest acute or chronic intracranial hemorrhage. No mass lesion, midline shift or mass effect. No hydrocephalus. No extra-axial fluid collection. Major dural sinuses grossly patent. Pituitary gland suprasellar region normal. Midline structures intact and normal. Major intracranial vascular flow voids are  maintained. Craniocervical junction within normal limits. Upper cervical spine demonstrates no acute abnormality. Mild degenerate spondylolysis noted at C4-5 with resultant mild spinal stenosis. Bone marrow signal intensity within normal limits. No scalp soft tissue abnormality. Globes and orbital soft tissues within normal limits. Mild scattered mucosal thickening within the ethmoidal air cells. Paranasal sinuses are otherwise clear. No mastoid effusion. Inner ear structures normal. MRA HEAD FINDINGS ANTERIOR CIRCULATION: Distal  cervical segments of the internal carotid arteries are patent with antegrade flow. Petrous, cavernous, and supraclinoid segments patent bilaterally without stenosis. ICA termini widely patent. A1 segments patent bilaterally. Normal anterior communicating artery. Anterior cerebral arteries widely patent to their distal aspects without stenosis. No M1 stenosis or occlusion. No proximal M2 occlusion. Distal MCA branches well perfused and symmetric. POSTERIOR CIRCULATION: Vertebral arteries patent to the vertebrobasilar junction without stenosis. Posterior inferior cerebral arteries patent proximally. Basilar artery widely patent to its distal aspect without stenosis. Superior cerebral arteries patent bilaterally. Both of the posterior cerebral artery supplied via the basilar and are widely patent to their distal aspects without stenosis. No aneurysm. MRA NECK FINDINGS Source images reviewed. Visualized aortic arch of normal caliber with normal branch pattern. No flow-limiting stenosis about the origin of the great vessels. 0 parent right subclavian artery noted. Visualized subclavian arteries patent without flow-limiting stenosis Right common carotid artery tortuous proximally but widely patent to the bifurcation without stenosis. No significant atheromatous narrowing about the right bifurcation. Right ICA tortuous but widely patent to the skull base without stenosis or occlusion. Left common  carotid artery tortuous proximally but widely patent to the bifurcation without stenosis. No significant atheromatous narrowing about the left bifurcation. Left ICA tortuous but widely patent to the skull base without stenosis or occlusion. Both of the vertebral arteries arise from the subclavian arteries. Vertebral arteries tortuous bilaterally. Left vertebral artery slightly dominant. Vertebral arteries patent within the neck without stenosis or occlusion. IMPRESSION: MRI HEAD IMPRESSION: 1. No acute intracranial abnormality identified. 2. Patchy T2/FLAIR hyperintensities involving the supratentorial cerebral white matter, nonspecific, but most likely related to chronic small vessel ischemic change, moderate in nature. MRA HEAD IMPRESSION: Normal MRA of the neck. No large vessel occlusion. No high-grade or correctable stenosis. MRA NECK IMPRESSION: 1. Negative MRA of the neck with no high-grade or critical flow limiting stenosis. 2. Diffuse tortuosity involving the major arterial vasculature of the neck, suggesting chronic underlying hypertension. Electronically Signed   By: Jeannine Boga M.D.   On: 12/24/2017 04:26     Assessment/Plan: 67 year old female presenting with episodes of aphasia.  They are unusual but stereotypical.  Has had work up in the past and told they were migraine. MRI and MRA of the brain reviewed and unremarkable.  Unclear etiology.  Had episode while in the room.  Patient able to read during the episode.    Recommendations: 1.  EEG 2.  TSH, B12, ESR, vitamin D 3.  Orthostatic vitals   Alexis Goodell, MD Neurology [phone] 12/24/2017, 6:50 PM

## 2017-12-24 NOTE — Progress Notes (Signed)
[facility] NAME: Terry Le    MR#:  [id number]  DATE OF BIRTH:  04-Nov-1950  SUBJECTIVE:  CHIEF COMPLAINT:   Chief Complaint  Patient presents with  . Altered Mental Status   Expressive aphasia and out of body experiences lasting a few seconds recurrently.  REVIEW OF SYSTEMS:    Review of Systems  Constitutional: Negative for chills and fever.  HENT: Negative for sore throat.   Eyes: Negative for blurred vision, double vision and pain.  Respiratory: Negative for cough, hemoptysis, shortness of breath and wheezing.   Cardiovascular: Negative for chest pain, palpitations, orthopnea and leg swelling.  Gastrointestinal: Negative for abdominal pain, constipation, diarrhea, heartburn, nausea and vomiting.  Genitourinary: Negative for dysuria and hematuria.  Musculoskeletal: Negative for back pain and joint pain.  Skin: Negative for rash.  Neurological: Negative for sensory change, speech change, focal weakness and headaches.  Endo/Heme/Allergies: Does not bruise/bleed easily.  Psychiatric/Behavioral: Negative for depression. The patient is not nervous/anxious.     DRUG ALLERGIES:  No Known Allergies  VITALS:  Blood pressure 121/77, pulse 87, temperature 98.3 F (36.8 C), temperature source Oral, resp. rate 18, height 4\' 10"  (1.473 m), weight 73.1 kg (161 lb 3.2 oz), SpO2 98 %.  PHYSICAL EXAMINATION:   Physical Exam  GENERAL:  67 y.o.-year-old patient lying in the bed with no acute distress.  EYES: Pupils equal, round, reactive to light and accommodation. No scleral icterus. Extraocular muscles intact.  HEENT: Head atraumatic, normocephalic. Oropharynx and nasopharynx clear.  NECK:  Supple, no jugular venous distention. No thyroid enlargement, no tenderness.  LUNGS: Normal breath sounds bilaterally, no wheezing, rales, rhonchi. No use of accessory muscles of respiration.  CARDIOVASCULAR: S1, S2 normal. No murmurs, rubs,  or gallops.  ABDOMEN: Soft, nontender, nondistended. Bowel sounds present. No organomegaly or mass.  EXTREMITIES: No cyanosis, clubbing or edema b/l.    NEUROLOGIC: Cranial nerves II through XII are intact. No focal Motor or sensory deficits b/l.   PSYCHIATRIC: The patient is alert and oriented x 3.  SKIN: No obvious rash, lesion, or ulcer.   LABORATORY PANEL:   CBC Recent Labs  Lab 12/23/17 1815  WBC 8.0  HGB 13.1  HCT 37.3  PLT 229   ------------------------------------------------------------------------------------------------------------------ Chemistries  Recent Labs  Lab 12/23/17 1815  NA 136  K 3.8  CL 106  CO2 21*  GLUCOSE 119*  BUN 16  CREATININE 0.81  CALCIUM 8.9  AST 29  ALT 20  ALKPHOS 56  BILITOT 0.6   ------------------------------------------------------------------------------------------------------------------  Cardiac Enzymes Recent Labs  Lab 12/23/17 1815  TROPONINI <0.03   ------------------------------------------------------------------------------------------------------------------  RADIOLOGY:  Ct Head Wo Contrast  Result Date: 12/23/2017 CLINICAL DATA:  Altered level of consciousness, migraines EXAM: CT HEAD WITHOUT CONTRAST TECHNIQUE: Contiguous axial images were obtained from the base of the skull through the vertex without intravenous contrast. COMPARISON:  06/12/2007 FINDINGS: Brain: There is atrophy and chronic small vessel disease changes. No acute intracranial abnormality. Specifically, no hemorrhage, hydrocephalus, mass lesion, acute infarction, or significant intracranial injury. Vascular: No hyperdense vessel or unexpected calcification. Skull: No acute calvarial abnormality. Sinuses/Orbits: Visualized paranasal sinuses and mastoids clear. Orbital soft tissues unremarkable. Other: None IMPRESSION: No acute intracranial abnormality. Atrophy, chronic microvascular disease. Electronically Signed   By: Rolm Baptise M.D.   On:  12/23/2017 18:55   Mr Jodene Nam Head Wo Contrast  Result Date: 12/24/2017 CLINICAL DATA:  Initial evaluation for acute altered mental status. EXAM: MRI HEAD WITHOUT  CONTRAST MRA HEAD WITHOUT CONTRAST MRA NECK WITHOUT AND WITH CONTRAST TECHNIQUE: Multiplanar, multiecho pulse sequences of the brain and surrounding structures were obtained without intravenous contrast. Angiographic images of the Circle of Willis were obtained using MRA technique without intravenous contrast. Angiographic images of the neck were obtained using MRA technique without and with intravenous contrast. Carotid stenosis measurements (when applicable) are obtained utilizing NASCET criteria, using the distal internal carotid diameter as the denominator. CONTRAST:  66mL MULTIHANCE GADOBENATE DIMEGLUMINE 529 MG/ML IV SOLN COMPARISON:  Prior CT from 12/23/2017 FINDINGS: MRI HEAD FINDINGS Generalized age related cerebral volume loss. Patchy and confluent T2/FLAIR hyperintensity within the periventricular, deep, and subcortical white matter of both cerebral hemispheres, nonspecific, but most likely related chronic small vessel ischemic change. Overall, appearance is moderate in nature. No abnormal foci of restricted diffusion to suggest acute or subacute ischemia. Gray-white matter differentiation maintained. No encephalomalacia to suggest chronic infarction. No foci of susceptibility artifact to suggest acute or chronic intracranial hemorrhage. No mass lesion, midline shift or mass effect. No hydrocephalus. No extra-axial fluid collection. Major dural sinuses grossly patent. Pituitary gland suprasellar region normal. Midline structures intact and normal. Major intracranial vascular flow voids are maintained. Craniocervical junction within normal limits. Upper cervical spine demonstrates no acute abnormality. Mild degenerate spondylolysis noted at C4-5 with resultant mild spinal stenosis. Bone marrow signal intensity within normal limits. No scalp soft  tissue abnormality. Globes and orbital soft tissues within normal limits. Mild scattered mucosal thickening within the ethmoidal air cells. Paranasal sinuses are otherwise clear. No mastoid effusion. Inner ear structures normal. MRA HEAD FINDINGS ANTERIOR CIRCULATION: Distal cervical segments of the internal carotid arteries are patent with antegrade flow. Petrous, cavernous, and supraclinoid segments patent bilaterally without stenosis. ICA termini widely patent. A1 segments patent bilaterally. Normal anterior communicating artery. Anterior cerebral arteries widely patent to their distal aspects without stenosis. No M1 stenosis or occlusion. No proximal M2 occlusion. Distal MCA branches well perfused and symmetric. POSTERIOR CIRCULATION: Vertebral arteries patent to the vertebrobasilar junction without stenosis. Posterior inferior cerebral arteries patent proximally. Basilar artery widely patent to its distal aspect without stenosis. Superior cerebral arteries patent bilaterally. Both of the posterior cerebral artery supplied via the basilar and are widely patent to their distal aspects without stenosis. No aneurysm. MRA NECK FINDINGS Source images reviewed. Visualized aortic arch of normal caliber with normal branch pattern. No flow-limiting stenosis about the origin of the great vessels. 0 parent right subclavian artery noted. Visualized subclavian arteries patent without flow-limiting stenosis Right common carotid artery tortuous proximally but widely patent to the bifurcation without stenosis. No significant atheromatous narrowing about the right bifurcation. Right ICA tortuous but widely patent to the skull base without stenosis or occlusion. Left common carotid artery tortuous proximally but widely patent to the bifurcation without stenosis. No significant atheromatous narrowing about the left bifurcation. Left ICA tortuous but widely patent to the skull base without stenosis or occlusion. Both of the  vertebral arteries arise from the subclavian arteries. Vertebral arteries tortuous bilaterally. Left vertebral artery slightly dominant. Vertebral arteries patent within the neck without stenosis or occlusion. IMPRESSION: MRI HEAD IMPRESSION: 1. No acute intracranial abnormality identified. 2. Patchy T2/FLAIR hyperintensities involving the supratentorial cerebral white matter, nonspecific, but most likely related to chronic small vessel ischemic change, moderate in nature. MRA HEAD IMPRESSION: Normal MRA of the neck. No large vessel occlusion. No high-grade or correctable stenosis. MRA NECK IMPRESSION: 1. Negative MRA of the neck with no high-grade or critical flow limiting stenosis. 2.  Diffuse tortuosity involving the major arterial vasculature of the neck, suggesting chronic underlying hypertension. Electronically Signed   By: Jeannine Boga M.D.   On: 12/24/2017 04:26   Mr Angiogram Neck W Or Wo Contrast  Result Date: 12/24/2017 CLINICAL DATA:  Initial evaluation for acute altered mental status. EXAM: MRI HEAD WITHOUT CONTRAST MRA HEAD WITHOUT CONTRAST MRA NECK WITHOUT AND WITH CONTRAST TECHNIQUE: Multiplanar, multiecho pulse sequences of the brain and surrounding structures were obtained without intravenous contrast. Angiographic images of the Circle of Willis were obtained using MRA technique without intravenous contrast. Angiographic images of the neck were obtained using MRA technique without and with intravenous contrast. Carotid stenosis measurements (when applicable) are obtained utilizing NASCET criteria, using the distal internal carotid diameter as the denominator. CONTRAST:  67mL MULTIHANCE GADOBENATE DIMEGLUMINE 529 MG/ML IV SOLN COMPARISON:  Prior CT from 12/23/2017 FINDINGS: MRI HEAD FINDINGS Generalized age related cerebral volume loss. Patchy and confluent T2/FLAIR hyperintensity within the periventricular, deep, and subcortical white matter of both cerebral hemispheres, nonspecific, but  most likely related chronic small vessel ischemic change. Overall, appearance is moderate in nature. No abnormal foci of restricted diffusion to suggest acute or subacute ischemia. Gray-white matter differentiation maintained. No encephalomalacia to suggest chronic infarction. No foci of susceptibility artifact to suggest acute or chronic intracranial hemorrhage. No mass lesion, midline shift or mass effect. No hydrocephalus. No extra-axial fluid collection. Major dural sinuses grossly patent. Pituitary gland suprasellar region normal. Midline structures intact and normal. Major intracranial vascular flow voids are maintained. Craniocervical junction within normal limits. Upper cervical spine demonstrates no acute abnormality. Mild degenerate spondylolysis noted at C4-5 with resultant mild spinal stenosis. Bone marrow signal intensity within normal limits. No scalp soft tissue abnormality. Globes and orbital soft tissues within normal limits. Mild scattered mucosal thickening within the ethmoidal air cells. Paranasal sinuses are otherwise clear. No mastoid effusion. Inner ear structures normal. MRA HEAD FINDINGS ANTERIOR CIRCULATION: Distal cervical segments of the internal carotid arteries are patent with antegrade flow. Petrous, cavernous, and supraclinoid segments patent bilaterally without stenosis. ICA termini widely patent. A1 segments patent bilaterally. Normal anterior communicating artery. Anterior cerebral arteries widely patent to their distal aspects without stenosis. No M1 stenosis or occlusion. No proximal M2 occlusion. Distal MCA branches well perfused and symmetric. POSTERIOR CIRCULATION: Vertebral arteries patent to the vertebrobasilar junction without stenosis. Posterior inferior cerebral arteries patent proximally. Basilar artery widely patent to its distal aspect without stenosis. Superior cerebral arteries patent bilaterally. Both of the posterior cerebral artery supplied via the basilar and are  widely patent to their distal aspects without stenosis. No aneurysm. MRA NECK FINDINGS Source images reviewed. Visualized aortic arch of normal caliber with normal branch pattern. No flow-limiting stenosis about the origin of the great vessels. 0 parent right subclavian artery noted. Visualized subclavian arteries patent without flow-limiting stenosis Right common carotid artery tortuous proximally but widely patent to the bifurcation without stenosis. No significant atheromatous narrowing about the right bifurcation. Right ICA tortuous but widely patent to the skull base without stenosis or occlusion. Left common carotid artery tortuous proximally but widely patent to the bifurcation without stenosis. No significant atheromatous narrowing about the left bifurcation. Left ICA tortuous but widely patent to the skull base without stenosis or occlusion. Both of the vertebral arteries arise from the subclavian arteries. Vertebral arteries tortuous bilaterally. Left vertebral artery slightly dominant. Vertebral arteries patent within the neck without stenosis or occlusion. IMPRESSION: MRI HEAD IMPRESSION: 1. No acute intracranial abnormality identified. 2. Patchy T2/FLAIR hyperintensities  involving the supratentorial cerebral white matter, nonspecific, but most likely related to chronic small vessel ischemic change, moderate in nature. MRA HEAD IMPRESSION: Normal MRA of the neck. No large vessel occlusion. No high-grade or correctable stenosis. MRA NECK IMPRESSION: 1. Negative MRA of the neck with no high-grade or critical flow limiting stenosis. 2. Diffuse tortuosity involving the major arterial vasculature of the neck, suggesting chronic underlying hypertension. Electronically Signed   By: Jeannine Boga M.D.   On: 12/24/2017 04:26   Mr Brain Wo Contrast  Result Date: 12/24/2017 CLINICAL DATA:  Initial evaluation for acute altered mental status. EXAM: MRI HEAD WITHOUT CONTRAST MRA HEAD WITHOUT CONTRAST MRA  NECK WITHOUT AND WITH CONTRAST TECHNIQUE: Multiplanar, multiecho pulse sequences of the brain and surrounding structures were obtained without intravenous contrast. Angiographic images of the Circle of Willis were obtained using MRA technique without intravenous contrast. Angiographic images of the neck were obtained using MRA technique without and with intravenous contrast. Carotid stenosis measurements (when applicable) are obtained utilizing NASCET criteria, using the distal internal carotid diameter as the denominator. CONTRAST:  73mL MULTIHANCE GADOBENATE DIMEGLUMINE 529 MG/ML IV SOLN COMPARISON:  Prior CT from 12/23/2017 FINDINGS: MRI HEAD FINDINGS Generalized age related cerebral volume loss. Patchy and confluent T2/FLAIR hyperintensity within the periventricular, deep, and subcortical white matter of both cerebral hemispheres, nonspecific, but most likely related chronic small vessel ischemic change. Overall, appearance is moderate in nature. No abnormal foci of restricted diffusion to suggest acute or subacute ischemia. Gray-white matter differentiation maintained. No encephalomalacia to suggest chronic infarction. No foci of susceptibility artifact to suggest acute or chronic intracranial hemorrhage. No mass lesion, midline shift or mass effect. No hydrocephalus. No extra-axial fluid collection. Major dural sinuses grossly patent. Pituitary gland suprasellar region normal. Midline structures intact and normal. Major intracranial vascular flow voids are maintained. Craniocervical junction within normal limits. Upper cervical spine demonstrates no acute abnormality. Mild degenerate spondylolysis noted at C4-5 with resultant mild spinal stenosis. Bone marrow signal intensity within normal limits. No scalp soft tissue abnormality. Globes and orbital soft tissues within normal limits. Mild scattered mucosal thickening within the ethmoidal air cells. Paranasal sinuses are otherwise clear. No mastoid effusion.  Inner ear structures normal. MRA HEAD FINDINGS ANTERIOR CIRCULATION: Distal cervical segments of the internal carotid arteries are patent with antegrade flow. Petrous, cavernous, and supraclinoid segments patent bilaterally without stenosis. ICA termini widely patent. A1 segments patent bilaterally. Normal anterior communicating artery. Anterior cerebral arteries widely patent to their distal aspects without stenosis. No M1 stenosis or occlusion. No proximal M2 occlusion. Distal MCA branches well perfused and symmetric. POSTERIOR CIRCULATION: Vertebral arteries patent to the vertebrobasilar junction without stenosis. Posterior inferior cerebral arteries patent proximally. Basilar artery widely patent to its distal aspect without stenosis. Superior cerebral arteries patent bilaterally. Both of the posterior cerebral artery supplied via the basilar and are widely patent to their distal aspects without stenosis. No aneurysm. MRA NECK FINDINGS Source images reviewed. Visualized aortic arch of normal caliber with normal branch pattern. No flow-limiting stenosis about the origin of the great vessels. 0 parent right subclavian artery noted. Visualized subclavian arteries patent without flow-limiting stenosis Right common carotid artery tortuous proximally but widely patent to the bifurcation without stenosis. No significant atheromatous narrowing about the right bifurcation. Right ICA tortuous but widely patent to the skull base without stenosis or occlusion. Left common carotid artery tortuous proximally but widely patent to the bifurcation without stenosis. No significant atheromatous narrowing about the left bifurcation. Left ICA tortuous but widely  patent to the skull base without stenosis or occlusion. Both of the vertebral arteries arise from the subclavian arteries. Vertebral arteries tortuous bilaterally. Left vertebral artery slightly dominant. Vertebral arteries patent within the neck without stenosis or  occlusion. IMPRESSION: MRI HEAD IMPRESSION: 1. No acute intracranial abnormality identified. 2. Patchy T2/FLAIR hyperintensities involving the supratentorial cerebral white matter, nonspecific, but most likely related to chronic small vessel ischemic change, moderate in nature. MRA HEAD IMPRESSION: Normal MRA of the neck. No large vessel occlusion. No high-grade or correctable stenosis. MRA NECK IMPRESSION: 1. Negative MRA of the neck with no high-grade or critical flow limiting stenosis. 2. Diffuse tortuosity involving the major arterial vasculature of the neck, suggesting chronic underlying hypertension. Electronically Signed   By: Jeannine Boga M.D.   On: 12/24/2017 04:26     ASSESSMENT AND PLAN:   * Aphasia and confusion that last only a few seconds Recurrent symptoms. Etiology unclear MRI showed nothing acute ASA Discussed with Dr. Doy Mince of neurology. Ordered EEG. Monitor overnight on tele  * HTN Well controlled  All the records are reviewed and case discussed with Care Management/Social Workerr. Management plans discussed with the patient, family and they are in agreement.  CODE STATUS: FULL CODE  DVT Prophylaxis: SCDs  TOTAL TIME TAKING CARE OF THIS PATIENT: 30 minutes.   POSSIBLE D/C IN 1-2 DAYS, DEPENDING ON CLINICAL CONDITION.  Leia Alf Bertrum Helmstetter M.D on 12/24/2017 at 6:01 PM  Between 7am to 6pm - Pager - [phone]  After 6pm go to [email or web address] - password [email or web address] [facility]  Office  [phone]  CC: Primary care physician; Idelle Crouch, MD  Note: This dictation was prepared with Dragon dictation along with smaller phrase technology. Any transcriptional errors that result from this process are unintentional.

## 2017-12-25 ENCOUNTER — Observation Stay (HOSPITAL_BASED_OUTPATIENT_CLINIC_OR_DEPARTMENT_OTHER)
Admit: 2017-12-25 | Discharge: 2017-12-25 | Disposition: A | Discharge status: Home / Self Care | Payer: Medicare Other | Attending: Internal Medicine | Admitting: Internal Medicine

## 2017-12-25 DIAGNOSIS — R4701 Aphasia: Secondary | ICD-10-CM

## 2017-12-25 DIAGNOSIS — I5030 Unspecified diastolic (congestive) heart failure: Secondary | ICD-10-CM | POA: Diagnosis not present

## 2017-12-25 LAB — ECHOCARDIOGRAM COMPLETE
HEIGHTINCHES: 58 in
WEIGHTICAEL: 2579.2 [oz_av]

## 2017-12-25 LAB — CORTISOL: Cortisol, Plasma: 6.8 ug/dL

## 2017-12-25 LAB — URINE CULTURE: Culture: NO GROWTH

## 2017-12-25 LAB — VITAMIN B12: VITAMIN B 12: 418 pg/mL (ref 180–914)

## 2017-12-25 MED ORDER — PROPRANOLOL HCL ER 80 MG PO CP24
80.0000 mg | ORAL_CAPSULE | Freq: Every day | ORAL | Status: DC
  Administered 2017-12-25: 11:00:00 80 mg via ORAL
  Filled 2017-12-25: qty 1

## 2017-12-25 MED ORDER — LOSARTAN POTASSIUM 50 MG PO TABS
50.0000 mg | ORAL_TABLET | Freq: Every day | ORAL | Status: DC
  Administered 2017-12-25: 50 mg via ORAL
  Filled 2017-12-25: qty 1

## 2017-12-25 MED ORDER — ASPIRIN EC 81 MG PO TBEC: 81.0000 mg | DELAYED_RELEASE_TABLET | Freq: Every day | ORAL | Status: AC

## 2017-12-25 NOTE — Discharge Instructions (Addendum)
° °  Aphasia Aphasia is damage to the part of your brain that you need to communicate. For most people, that area is on the left side of the brain. Aphasia does not affect your intelligence, but you may struggle to talk, understand speech, read, or write. Aphasia can happen to anyone at any age, but it is most common in older age. What are the causes? An interruption of blood supply to the brain (stroke) is the most common cause of aphasia. Any disease or disorder that damages the communication areas of the brain can cause aphasia. This includes:  Brain tumors.  Brain injuries.  Brain infections.  Progressive diseases of the nervous system (neurological disorders).  What increases the risk? You may be at risk for aphasia if you have had any trauma, disease, or disorder that damaged the communication areas of the brain. What are the signs or symptoms? Aphasia may start suddenly if it is caused by a stroke or brain injury. Aphasia caused by a tumor or a progressive neurological disorder may start gradually. The condition affects people differently. Signs and symptoms of aphasia include:  Trouble finding the right word.  Using the wrong words.  Talking in sentences that do not make sense.  Making up words.  Being unable to understand other people's speech.  Having problems writing, spelling, or reading.  Having trouble with numbers.  Having trouble swallowing.  How is this diagnosed? Your health care provider may suspect you have aphasia if you lose the ability to speak or understand language. You may need to see a specialist (speech and language pathologist) to help determine the diagnosis of aphasia. This person may do a series of tests to check your ability to:  Speak.  Express ideas.  Make conversation.  Understand speech.  Read and write.  How is this treated? In some cases, aphasia may improve on its own over time. Treatment for aphasia usually involves therapy with  a pathologist. Your treatment will be designed to meet your needs and abilities. Common treatments include:  Speech therapy.  Learning other ways to communicate.  Working with family members to find the best ways to communicate.  Working with an occupational therapist to find ways to communicate at work.  Follow these instructions at home:  Keep all follow-up appointments.  Make sure you have a good support system at home.  The following techniques may be helpful while communicating: ? Use short, simple sentences. Ask family members to do the same. Sentences that require one-word answers are easiest. ? Avoid distractions like background noise when trying to listen or talk. ? Try communicating with gestures, pointing, or drawing. ? Talk slowly. Ask family members to talk to you slowly. ? Maintain eye contact when communicating. Contact a health care provider if:  Your symptoms change or get worse.  You need more support at home.  You are struggling with anxiety or depression.  You develop trouble swallowing. This information is not intended to replace advice given to you by your health care provider. Make sure you discuss any questions you have with your health care provider. Document Released: 04/15/2002 Document Revised: 02/11/2016 Document Reviewed: 10/13/2013 Elsevier Interactive Patient Education  2018 [employer] and activity as before

## 2017-12-25 NOTE — Progress Notes (Addendum)
Subjective: Patient having fewer episodes.  Has only had two since last seen on yesterday.    Objective: Current vital signs: BP (!) 153/85 (BP Location: Right Arm)   Pulse 91   Temp 98.7 F (37.1 C) (Oral)   Resp 13   Ht _0  (1.473 m)   Wt 73.1 kg (161 lb 3.2 oz)   SpO2 96%   BMI 33.69 kg/m  Vital signs in last 24 hours: Temp:  [98.1 F (36.7 C)-99.2 F (37.3 C)] 98.7 F (37.1 C) (05/21 0339) Pulse Rate:  [90-107] 91 (05/21 1116) Resp:  [13-14] 13 (05/21 0339) BP: (132-153)/(85-94) 153/85 (05/21 1116) SpO2:  [94 %-98 %] 96 % (05/21 0339)  Intake/Output from previous day: 05/20 0701 - 05/21 0700 In: 220 [P.O.:120; IV Piggyback:100] Out: 300 [Urine:300] Intake/Output this shift: No intake/output data recorded. Nutritional status:  Diet Order           Diet regular Room service appropriate? Yes; Fluid consistency: Thin  Diet effective now          Neurologic Exam: Mental Status: Alert, oriented, thought content appropriate.  Speech fluent without evidence of aphasia.  Able to follow 3 step commands without difficulty. Cranial Nerves: II: Discs flat bilaterally; Visual fields grossly normal, pupils equal, round, reactive to light and accommodation III,IV, VI: ptosis not present, extra-ocular motions intact bilaterally V,VII: smile symmetric, facial light touch sensation normal bilaterally VIII: hearing normal bilaterally IX,X: gag reflex present XI: bilateral shoulder shrug XII: midline tongue extension Motor: 5/5 throughout Sensory: Pinprick and light touch intact throughout, bilaterally   Lab Results: Basic Metabolic Panel: Recent Labs  Lab 12/23/17 1815  NA 136  K 3.8  CL 106  CO2 21*  GLUCOSE 119*  BUN 16  CREATININE 0.81  CALCIUM 8.9    Liver Function Tests: Recent Labs  Lab 12/23/17 1815  AST 29  ALT 20  ALKPHOS 56  BILITOT 0.6  PROT 6.9  ALBUMIN 4.2   No results for input(s): LIPASE, AMYLASE in the last 168 hours. No results for  input(s): AMMONIA in the last 168 hours.  CBC: Recent Labs  Lab 12/23/17 1815  WBC 8.0  NEUTROABS 5.7  HGB 13.1  HCT 37.3  MCV 90.0  PLT 229    Cardiac Enzymes: Recent Labs  Lab 12/23/17 1815  TROPONINI <0.03    Lipid Panel: Recent Labs  Lab 12/24/17 0425  CHOL 161  TRIG 88  HDL 78  CHOLHDL 2.1  VLDL 18  LDLCALC 65    CBG: No results for input(s): GLUCAP in the last 168 hours.  Microbiology: Results for orders placed or performed during the hospital encounter of 12/23/17  Urine Culture     Status: None   Collection Time: 12/23/17  8:34 PM  Result Value Ref Range Status   Specimen Description   Final    URINE, RANDOM Performed at [facility], [address] [place], [place] [address]    Special Requests   Final    NONE Performed at [facility], [address] [place], [place] [address]    Culture   Final    NO GROWTH Performed at [facility], [place] [address], [place], [place] [address]    Report Status 12/25/2017 FINAL  Final    Coagulation Studies: Recent Labs    12/23/17 1815  LABPROT 12.1  INR 0.90    Imaging: Ct Head Wo Contrast  Result Date: 12/23/2017 CLINICAL DATA:  Altered level of consciousness, migraines  EXAM: CT HEAD WITHOUT CONTRAST TECHNIQUE: Contiguous axial images were obtained from the base of the skull through the vertex without intravenous contrast. COMPARISON:  06/12/2007 FINDINGS: Brain: There is atrophy and chronic small vessel disease changes. No acute intracranial abnormality. Specifically, no hemorrhage, hydrocephalus, mass lesion, acute infarction, or significant intracranial injury. Vascular: No hyperdense vessel or unexpected calcification. Skull: No acute calvarial abnormality. Sinuses/Orbits: Visualized paranasal sinuses and mastoids clear. Orbital soft tissues unremarkable. Other: None IMPRESSION: No acute intracranial abnormality. Atrophy, chronic microvascular disease. Electronically Signed    By: Rolm Baptise M.D.   On: 12/23/2017 18:55   Mr Jodene Nam Head Wo Contrast  Result Date: 12/24/2017 CLINICAL DATA:  Initial evaluation for acute altered mental status. EXAM: MRI HEAD WITHOUT CONTRAST MRA HEAD WITHOUT CONTRAST MRA NECK WITHOUT AND WITH CONTRAST TECHNIQUE: Multiplanar, multiecho pulse sequences of the brain and surrounding structures were obtained without intravenous contrast. Angiographic images of the Circle of Willis were obtained using MRA technique without intravenous contrast. Angiographic images of the neck were obtained using MRA technique without and with intravenous contrast. Carotid stenosis measurements (when applicable) are obtained utilizing NASCET criteria, using the distal internal carotid diameter as the denominator. CONTRAST:  65m MULTIHANCE GADOBENATE DIMEGLUMINE 529 MG/ML IV SOLN COMPARISON:  Prior CT from 12/23/2017 FINDINGS: MRI HEAD FINDINGS Generalized age related cerebral volume loss. Patchy and confluent T2/FLAIR hyperintensity within the periventricular, deep, and subcortical white matter of both cerebral hemispheres, nonspecific, but most likely related chronic small vessel ischemic change. Overall, appearance is moderate in nature. No abnormal foci of restricted diffusion to suggest acute or subacute ischemia. Gray-white matter differentiation maintained. No encephalomalacia to suggest chronic infarction. No foci of susceptibility artifact to suggest acute or chronic intracranial hemorrhage. No mass lesion, midline shift or mass effect. No hydrocephalus. No extra-axial fluid collection. Major dural sinuses grossly patent. Pituitary gland suprasellar region normal. Midline structures intact and normal. Major intracranial vascular flow voids are maintained. Craniocervical junction within normal limits. Upper cervical spine demonstrates no acute abnormality. Mild degenerate spondylolysis noted at C4-5 with resultant mild spinal stenosis. Bone marrow signal intensity within  normal limits. No scalp soft tissue abnormality. Globes and orbital soft tissues within normal limits. Mild scattered mucosal thickening within the ethmoidal air cells. Paranasal sinuses are otherwise clear. No mastoid effusion. Inner ear structures normal. MRA HEAD FINDINGS ANTERIOR CIRCULATION: Distal cervical segments of the internal carotid arteries are patent with antegrade flow. Petrous, cavernous, and supraclinoid segments patent bilaterally without stenosis. ICA termini widely patent. A1 segments patent bilaterally. Normal anterior communicating artery. Anterior cerebral arteries widely patent to their distal aspects without stenosis. No M1 stenosis or occlusion. No proximal M2 occlusion. Distal MCA branches well perfused and symmetric. POSTERIOR CIRCULATION: Vertebral arteries patent to the vertebrobasilar junction without stenosis. Posterior inferior cerebral arteries patent proximally. Basilar artery widely patent to its distal aspect without stenosis. Superior cerebral arteries patent bilaterally. Both of the posterior cerebral artery supplied via the basilar and are widely patent to their distal aspects without stenosis. No aneurysm. MRA NECK FINDINGS Source images reviewed. Visualized aortic arch of normal caliber with normal branch pattern. No flow-limiting stenosis about the origin of the great vessels. 0 parent right subclavian artery noted. Visualized subclavian arteries patent without flow-limiting stenosis Right common carotid artery tortuous proximally but widely patent to the bifurcation without stenosis. No significant atheromatous narrowing about the right bifurcation. Right ICA tortuous but widely patent to the skull base without stenosis or occlusion. Left common carotid artery tortuous proximally but widely  patent to the bifurcation without stenosis. No significant atheromatous narrowing about the left bifurcation. Left ICA tortuous but widely patent to the skull base without stenosis or  occlusion. Both of the vertebral arteries arise from the subclavian arteries. Vertebral arteries tortuous bilaterally. Left vertebral artery slightly dominant. Vertebral arteries patent within the neck without stenosis or occlusion. IMPRESSION: MRI HEAD IMPRESSION: 1. No acute intracranial abnormality identified. 2. Patchy T2/FLAIR hyperintensities involving the supratentorial cerebral white matter, nonspecific, but most likely related to chronic small vessel ischemic change, moderate in nature. MRA HEAD IMPRESSION: Normal MRA of the neck. No large vessel occlusion. No high-grade or correctable stenosis. MRA NECK IMPRESSION: 1. Negative MRA of the neck with no high-grade or critical flow limiting stenosis. 2. Diffuse tortuosity involving the major arterial vasculature of the neck, suggesting chronic underlying hypertension. Electronically Signed   By: Jeannine Boga M.D.   On: 12/24/2017 04:26   Mr Angiogram Neck W Or Wo Contrast  Result Date: 12/24/2017 CLINICAL DATA:  Initial evaluation for acute altered mental status. EXAM: MRI HEAD WITHOUT CONTRAST MRA HEAD WITHOUT CONTRAST MRA NECK WITHOUT AND WITH CONTRAST TECHNIQUE: Multiplanar, multiecho pulse sequences of the brain and surrounding structures were obtained without intravenous contrast. Angiographic images of the Circle of Willis were obtained using MRA technique without intravenous contrast. Angiographic images of the neck were obtained using MRA technique without and with intravenous contrast. Carotid stenosis measurements (when applicable) are obtained utilizing NASCET criteria, using the distal internal carotid diameter as the denominator. CONTRAST:  25m MULTIHANCE GADOBENATE DIMEGLUMINE 529 MG/ML IV SOLN COMPARISON:  Prior CT from 12/23/2017 FINDINGS: MRI HEAD FINDINGS Generalized age related cerebral volume loss. Patchy and confluent T2/FLAIR hyperintensity within the periventricular, deep, and subcortical white matter of both cerebral  hemispheres, nonspecific, but most likely related chronic small vessel ischemic change. Overall, appearance is moderate in nature. No abnormal foci of restricted diffusion to suggest acute or subacute ischemia. Gray-white matter differentiation maintained. No encephalomalacia to suggest chronic infarction. No foci of susceptibility artifact to suggest acute or chronic intracranial hemorrhage. No mass lesion, midline shift or mass effect. No hydrocephalus. No extra-axial fluid collection. Major dural sinuses grossly patent. Pituitary gland suprasellar region normal. Midline structures intact and normal. Major intracranial vascular flow voids are maintained. Craniocervical junction within normal limits. Upper cervical spine demonstrates no acute abnormality. Mild degenerate spondylolysis noted at C4-5 with resultant mild spinal stenosis. Bone marrow signal intensity within normal limits. No scalp soft tissue abnormality. Globes and orbital soft tissues within normal limits. Mild scattered mucosal thickening within the ethmoidal air cells. Paranasal sinuses are otherwise clear. No mastoid effusion. Inner ear structures normal. MRA HEAD FINDINGS ANTERIOR CIRCULATION: Distal cervical segments of the internal carotid arteries are patent with antegrade flow. Petrous, cavernous, and supraclinoid segments patent bilaterally without stenosis. ICA termini widely patent. A1 segments patent bilaterally. Normal anterior communicating artery. Anterior cerebral arteries widely patent to their distal aspects without stenosis. No M1 stenosis or occlusion. No proximal M2 occlusion. Distal MCA branches well perfused and symmetric. POSTERIOR CIRCULATION: Vertebral arteries patent to the vertebrobasilar junction without stenosis. Posterior inferior cerebral arteries patent proximally. Basilar artery widely patent to its distal aspect without stenosis. Superior cerebral arteries patent bilaterally. Both of the posterior cerebral artery  supplied via the basilar and are widely patent to their distal aspects without stenosis. No aneurysm. MRA NECK FINDINGS Source images reviewed. Visualized aortic arch of normal caliber with normal branch pattern. No flow-limiting stenosis about the origin of the great vessels. 0  parent right subclavian artery noted. Visualized subclavian arteries patent without flow-limiting stenosis Right common carotid artery tortuous proximally but widely patent to the bifurcation without stenosis. No significant atheromatous narrowing about the right bifurcation. Right ICA tortuous but widely patent to the skull base without stenosis or occlusion. Left common carotid artery tortuous proximally but widely patent to the bifurcation without stenosis. No significant atheromatous narrowing about the left bifurcation. Left ICA tortuous but widely patent to the skull base without stenosis or occlusion. Both of the vertebral arteries arise from the subclavian arteries. Vertebral arteries tortuous bilaterally. Left vertebral artery slightly dominant. Vertebral arteries patent within the neck without stenosis or occlusion. IMPRESSION: MRI HEAD IMPRESSION: 1. No acute intracranial abnormality identified. 2. Patchy T2/FLAIR hyperintensities involving the supratentorial cerebral white matter, nonspecific, but most likely related to chronic small vessel ischemic change, moderate in nature. MRA HEAD IMPRESSION: Normal MRA of the neck. No large vessel occlusion. No high-grade or correctable stenosis. MRA NECK IMPRESSION: 1. Negative MRA of the neck with no high-grade or critical flow limiting stenosis. 2. Diffuse tortuosity involving the major arterial vasculature of the neck, suggesting chronic underlying hypertension. Electronically Signed   By: Jeannine Boga M.D.   On: 12/24/2017 04:26   Mr Brain Wo Contrast  Result Date: 12/24/2017 CLINICAL DATA:  Initial evaluation for acute altered mental status. EXAM: MRI HEAD WITHOUT CONTRAST  MRA HEAD WITHOUT CONTRAST MRA NECK WITHOUT AND WITH CONTRAST TECHNIQUE: Multiplanar, multiecho pulse sequences of the brain and surrounding structures were obtained without intravenous contrast. Angiographic images of the Circle of Willis were obtained using MRA technique without intravenous contrast. Angiographic images of the neck were obtained using MRA technique without and with intravenous contrast. Carotid stenosis measurements (when applicable) are obtained utilizing NASCET criteria, using the distal internal carotid diameter as the denominator. CONTRAST:  10m MULTIHANCE GADOBENATE DIMEGLUMINE 529 MG/ML IV SOLN COMPARISON:  Prior CT from 12/23/2017 FINDINGS: MRI HEAD FINDINGS Generalized age related cerebral volume loss. Patchy and confluent T2/FLAIR hyperintensity within the periventricular, deep, and subcortical white matter of both cerebral hemispheres, nonspecific, but most likely related chronic small vessel ischemic change. Overall, appearance is moderate in nature. No abnormal foci of restricted diffusion to suggest acute or subacute ischemia. Gray-white matter differentiation maintained. No encephalomalacia to suggest chronic infarction. No foci of susceptibility artifact to suggest acute or chronic intracranial hemorrhage. No mass lesion, midline shift or mass effect. No hydrocephalus. No extra-axial fluid collection. Major dural sinuses grossly patent. Pituitary gland suprasellar region normal. Midline structures intact and normal. Major intracranial vascular flow voids are maintained. Craniocervical junction within normal limits. Upper cervical spine demonstrates no acute abnormality. Mild degenerate spondylolysis noted at C4-5 with resultant mild spinal stenosis. Bone marrow signal intensity within normal limits. No scalp soft tissue abnormality. Globes and orbital soft tissues within normal limits. Mild scattered mucosal thickening within the ethmoidal air cells. Paranasal sinuses are otherwise  clear. No mastoid effusion. Inner ear structures normal. MRA HEAD FINDINGS ANTERIOR CIRCULATION: Distal cervical segments of the internal carotid arteries are patent with antegrade flow. Petrous, cavernous, and supraclinoid segments patent bilaterally without stenosis. ICA termini widely patent. A1 segments patent bilaterally. Normal anterior communicating artery. Anterior cerebral arteries widely patent to their distal aspects without stenosis. No M1 stenosis or occlusion. No proximal M2 occlusion. Distal MCA branches well perfused and symmetric. POSTERIOR CIRCULATION: Vertebral arteries patent to the vertebrobasilar junction without stenosis. Posterior inferior cerebral arteries patent proximally. Basilar artery widely patent to its distal aspect without stenosis. Superior cerebral  arteries patent bilaterally. Both of the posterior cerebral artery supplied via the basilar and are widely patent to their distal aspects without stenosis. No aneurysm. MRA NECK FINDINGS Source images reviewed. Visualized aortic arch of normal caliber with normal branch pattern. No flow-limiting stenosis about the origin of the great vessels. 0 parent right subclavian artery noted. Visualized subclavian arteries patent without flow-limiting stenosis Right common carotid artery tortuous proximally but widely patent to the bifurcation without stenosis. No significant atheromatous narrowing about the right bifurcation. Right ICA tortuous but widely patent to the skull base without stenosis or occlusion. Left common carotid artery tortuous proximally but widely patent to the bifurcation without stenosis. No significant atheromatous narrowing about the left bifurcation. Left ICA tortuous but widely patent to the skull base without stenosis or occlusion. Both of the vertebral arteries arise from the subclavian arteries. Vertebral arteries tortuous bilaterally. Left vertebral artery slightly dominant. Vertebral arteries patent within the neck  without stenosis or occlusion. IMPRESSION: MRI HEAD IMPRESSION: 1. No acute intracranial abnormality identified. 2. Patchy T2/FLAIR hyperintensities involving the supratentorial cerebral white matter, nonspecific, but most likely related to chronic small vessel ischemic change, moderate in nature. MRA HEAD IMPRESSION: Normal MRA of the neck. No large vessel occlusion. No high-grade or correctable stenosis. MRA NECK IMPRESSION: 1. Negative MRA of the neck with no high-grade or critical flow limiting stenosis. 2. Diffuse tortuosity involving the major arterial vasculature of the neck, suggesting chronic underlying hypertension. Electronically Signed   By: Jeannine Boga M.D.   On: 12/24/2017 04:26    Medications:  I have reviewed the patient's current medications. Scheduled: . enoxaparin (LOVENOX) injection  40 mg Subcutaneous Q24H  . losartan  50 mg Oral Daily  . pantoprazole  40 mg Oral Daily  . propranolol ER  80 mg Oral Daily  . rosuvastatin  10 mg Oral QHS    Assessment/Plan: Patient with fewer events.  May be related to infection.  EEG unremarkable.  Patient not orthostatic.  TSH and ESR are normal.    Recommendations: 1. Cortisol 2. Patient to follow up with neurology at discharge and if symptoms persist despite full treatment of infection would consider 24-48 hour EEG monitoring.   3. ASA 30m daily   LOS: 0 days   LAlexis Goodell MD Neurology [phone]/21/2019  2:00 PM

## 2017-12-25 NOTE — Progress Notes (Signed)
SWOT assessed MEWS. Patient with SBP in 150's. Per review of medications patient's home regimen patient takes medications for HTN. Requested MD to review medications to assist with elevated B/P. MD agreed.

## 2017-12-25 NOTE — Progress Notes (Signed)
MD making rounds. Orders received to discharge home. IV removed. Discharge paperwork provided, explained, signed and witnessed. No unanswered questions. Discharged via w/c by auxiliary staff. Belongings sent with patient and family.

## 2017-12-25 NOTE — Progress Notes (Signed)
*  PRELIMINARY RESULTS* Echocardiogram 2D Echocardiogram has been performed.  Terry Le 12/25/2017, 9:55 AM

## 2017-12-25 NOTE — Progress Notes (Signed)
New orders provided by MD for HTN medications.

## 2017-12-26 LAB — VITAMIN D 25 HYDROXY (VIT D DEFICIENCY, FRACTURES): VIT D 25 HYDROXY: 41.5 ng/mL (ref 30.0–100.0)

## 2017-12-28 NOTE — Discharge Summary (Signed)
Terry Le at [place] NAME: Terry Le    MR#:  [id number]  DATE OF BIRTH:  11-Jun-1951  DATE OF ADMISSION:  12/23/2017 ADMITTING PHYSICIAN: Lance Coon, MD  DATE OF DISCHARGE: 12/25/2017  3:24 PM  PRIMARY CARE PHYSICIAN: Idelle Crouch, MD   ADMISSION DIAGNOSIS:  Aphasia [R47.01] Acute cystitis without hematuria [N30.00] Altered mental status, unspecified altered mental status type [R41.82]  DISCHARGE DIAGNOSIS:  Principal Problem:   Aphasia Active Problems:   Hypertension   Hyperlipidemia   GERD (gastroesophageal reflux disease)   SECONDARY DIAGNOSIS:   Past Medical History:  Diagnosis Date  . Ankylosing spondylitis ([place])   . Hiatal hernia   . History of peptic ulcer disease   . History of syncope 2000   w/negative work up  . Hyperlipidemia   . Hyperlipidemia   . Hypertension   . Menopausal state   . Osteoporosis   . Osteoporosis   . PUD (peptic ulcer disease)   . Syncope      ADMITTING HISTORY  HISTORY OF PRESENT ILLNESS:  Terry Le  is a 67 y.o. female who presents with stuttering aphasia.  Patient states that for the past 2 days she has been having increasing frequency of short episodes of transient aphasia.  She states that she will feel these episodes coming on, but cannot describe what she feels.  She will then have her episode of difficulty getting her words out for a few minutes.  Some episodes are followed by transient headache.  Family member at bedside states that on a few episodes she is checked her blood pressure and it has been low during the aphasic.,  But then returns to normal shortly thereafter.  Initial work-up in the ED is within normal limits, hospitalist called for admission and further evaluation.  HOSPITAL COURSE:   * Aphasia and confusion that last only a few seconds Recurrent symptoms. Etiology unclear MRI showed nothing acute ASA Discussed with Dr. Doy Mince of neurology. EEG  was normal. Monitored overnight with no symptoms. Patient will follow-up with neurology is outpatient. Prior Dr. Doy Mince UTI could be contributing. Further symptoms will need 24 hour EEG.  * HTN Well controlled  Stable for discharge home.   CONSULTS OBTAINED:  Treatment Team:  Alexis Goodell, MD  DRUG ALLERGIES:  No Known Allergies  DISCHARGE MEDICATIONS:   Allergies as of 12/25/2017   No Known Allergies     Medication List    TAKE these medications   ALPRAZolam 0.25 MG tablet Commonly known as:  XANAX Take 0.25 mg by mouth at bedtime as needed for anxiety or sleep.   aspirin EC 81 MG tablet Take 1 tablet (81 mg total) by mouth daily.   losartan 50 MG tablet Commonly known as:  COZAAR Take 50 mg by mouth daily.   omeprazole 40 MG capsule Commonly known as:  PRILOSEC Take 40 mg by mouth 2 (two) times daily.   PRESERVISION AREDS 2 PO Take 1 capsule by mouth daily.   propranolol 80 MG tablet Commonly known as:  INDERAL Take 80 mg by mouth daily.   rosuvastatin 10 MG tablet Commonly known as:  CRESTOR Take 10 mg by mouth at bedtime.       Today   VITAL SIGNS:  Blood pressure (!) 153/85, pulse 91, temperature 98.7 F (37.1 C), temperature source Oral, resp. rate 13, height 4\' 10"  (1.473 m), weight 73.1 kg (161 lb 3.2 oz), SpO2 96 %.  I/O:  No intake  or output data in the 24 hours ending 12/28/17 1640  PHYSICAL EXAMINATION:  Physical Exam  GENERAL:  67 y.o.-year-old patient lying in the bed with no acute distress.  LUNGS: Normal breath sounds bilaterally, no wheezing, rales,rhonchi or crepitation. No use of accessory muscles of respiration.  CARDIOVASCULAR: S1, S2 normal. No murmurs, rubs, or gallops.  ABDOMEN: Soft, non-tender, non-distended. Bowel sounds present. No organomegaly or mass.  NEUROLOGIC: Moves all 4 extremities. PSYCHIATRIC: The patient is alert and oriented x 3.  SKIN: No obvious rash, lesion, or ulcer.   DATA REVIEW:    CBC Recent Labs  Lab 12/23/17 1815  WBC 8.0  HGB 13.1  HCT 37.3  PLT 229    Chemistries  Recent Labs  Lab 12/23/17 1815  NA 136  K 3.8  CL 106  CO2 21*  GLUCOSE 119*  BUN 16  CREATININE 0.81  CALCIUM 8.9  AST 29  ALT 20  ALKPHOS 56  BILITOT 0.6    Cardiac Enzymes Recent Labs  Lab 12/23/17 1815  TROPONINI <0.03    Microbiology Results  Results for orders placed or performed during the hospital encounter of 12/23/17  Urine Culture     Status: None   Collection Time: 12/23/17  8:34 PM  Result Value Ref Range Status   Specimen Description   Final    URINE, RANDOM Performed at [facility], [address] [place], [place] [address]    Special Requests   Final    NONE Performed at [facility], [address], [place], [place] [address]    Culture   Final    NO GROWTH Performed at [facility], [place] [address], [place], [place] [address]    Report Status 12/25/2017 FINAL  Final    RADIOLOGY:  No results found.  Follow up with PCP in 1 week.  Management plans discussed with the patient, family and they are in agreement.  CODE STATUS:  Code Status History    Date Active Date Inactive Code Status Order ID Comments User Context   12/23/2017 2309 12/25/2017 1829 Full Code [id number]  Lance Coon, MD Inpatient      TOTAL TIME TAKING CARE OF THIS PATIENT ON DAY OF DISCHARGE: more than 30 minutes.   Leia Alf Hailey Stormer M.D on 12/28/2017 at 4:40 PM  Between 7am to 6pm - Pager - [phone]  After 6pm go to [email or web address] - password [email or web address] [facility]  Office  [phone]  CC: Primary care physician; Idelle Crouch, MD  Note: This dictation was prepared with Dragon dictation along with smaller phrase technology. Any transcriptional errors that result from this process are unintentional.

## 2018-02-28 NOTE — Progress Notes (Signed)
Cardiology Office Note  Date:  03/01/2018   ID:  Terry, Le 1950/09/21, MRN [id number]  PCP:  Idelle Crouch, MD   Chief Complaint  Patient presents with  . other    Follow up from [facility]; aphasia and altered mental status. Meds reviewed by the pt. verbally.     HPI:  Terry Le is a very pleasant 67 year old woman with a history of obesity,  GERD,  hiatal hernia,  hyperlipidemia,  syncope in 2000 with workup at that time but these details are unavailable,  previous history of chest pain with prior negative cardiac workup  Long history of upper chest discomfort from hiatal hernia. Previous diverticuli or diverticulitis. Requiring antibiotics x2 with improvement of her symptoms. who presents for routine followup of her hypertension and previous episodes of chest pain, recent aphasia  Seen in the hospital May 2019 with aphasia Difficulty getting words out for a few minutes at a time Transient headaches MRI had and neck showing nothing acute chronic small vessel ischemic change, moderate in nature. EEG normal, seen by neurology She did have UTI  Neurology concerned for atrial fib Requesting a monitor  Having H/A, better now since hospitalization  HBA1C 6.1 Total chol 147, LDL 57  EKG personally reviewed by myself on todays visit Shows normal sinus rhythm rate 81 bpm no significant ST or T-wave changes  Other past medical history reviewed  hysterectomy in May 2015, urinary tract infection.  stress test several years ago which was reportedly normal.  EGD, thoracic MRI showing peptic ulcer disease and moderate to large hiatal hernia. EGD in 2006 showing medium hiatal hernia, reactive gastropathy.  Some Improvement in her GI symptoms on dexilant.   PMH:   has a past medical history of Ankylosing spondylitis ([place]), Hiatal hernia, History of peptic ulcer disease, History of syncope (2000), Hyperlipidemia, Hyperlipidemia, Hypertension, Menopausal state,  Osteoporosis, Osteoporosis, PUD (peptic ulcer disease), and Syncope.  PSH:    Past Surgical History:  Procedure Laterality Date  . BREAST REDUCTION SURGERY    . COLONOSCOPY    . COLONOSCOPY WITH PROPOFOL N/A 04/28/2015   Procedure: COLONOSCOPY WITH PROPOFOL;  Surgeon: Manya Silvas, MD;  Location: [facility]COPY;  Service: Endoscopy;  Laterality: N/A;  . REDUCTION MAMMAPLASTY  2013  . TONSILLECTOMY      Current Outpatient Medications  Medication Sig Dispense Refill  . ALPRAZolam (XANAX) 0.25 MG tablet Take 0.25 mg by mouth at bedtime as needed for anxiety or sleep.     Marland Kitchen aspirin EC 81 MG tablet Take 1 tablet (81 mg total) by mouth daily.    Marland Kitchen losartan (COZAAR) 50 MG tablet Take 50 mg by mouth daily.    . Multiple Vitamins-Minerals (PRESERVISION AREDS 2 PO) Take 1 capsule by mouth daily.     Marland Kitchen omeprazole (PRILOSEC) 40 MG capsule Take 40 mg by mouth 2 (two) times daily.    . propranolol (INDERAL) 80 MG tablet Take 80 mg by mouth daily.    . rosuvastatin (CRESTOR) 10 MG tablet Take 10 mg by mouth at bedtime.      No current facility-administered medications for this visit.      Allergies:   Patient has no known allergies.   Social History:  The patient  reports that she has never smoked. She has never used smokeless tobacco. She reports that she does not drink alcohol or use drugs.   Family History:   family history includes Breast cancer (age of onset: 61) in her maternal aunt;  Heart attack in her father.    Review of Systems: Review of Systems  Constitutional: Negative.   Respiratory: Negative.   Cardiovascular: Negative.   Gastrointestinal: Negative.   Musculoskeletal: Negative.   Neurological: Negative.        Aphasia now resolved  Psychiatric/Behavioral: Negative.   All other systems reviewed and are negative.    PHYSICAL EXAM: VS:  BP 140/86 (BP Location: Left Arm, Patient Position: Sitting, Cuff Size: Normal)   Pulse 81   Ht 4' 10.5" (1.486 m)   Wt 164 lb  (74.4 kg)   BMI 33.69 kg/m  , BMI Body mass index is 33.69 kg/m. Constitutional:  oriented to person, place, and time. No distress.  HENT:  Head: Normocephalic and atraumatic.  Eyes:  no discharge. No scleral icterus.  Neck: Normal range of motion. Neck supple. No JVD present.  Cardiovascular: Normal rate, regular rhythm, normal heart sounds and intact distal pulses. Exam reveals no gallop and no friction rub. No edema No murmur heard. Pulmonary/Chest: Effort normal and breath sounds normal. No stridor. No respiratory distress.  no wheezes.  no rales.  no tenderness.  Abdominal: Soft.  no distension.  no tenderness.  Musculoskeletal: Normal range of motion.  no  tenderness or deformity.  Neurological:  normal muscle tone. Coordination normal. No atrophy Skin: Skin is warm and dry. No rash noted. not diaphoretic.  Psychiatric:  normal mood and affect. behavior is normal. Thought content normal.     Recent Labs: 12/23/2017: ALT 20; BUN 16; Creatinine, Ser 0.81; Hemoglobin 13.1; Platelets 229; Potassium 3.8; Sodium 136 12/24/2017: TSH 0.805    Lipid Panel Lab Results  Component Value Date   CHOL 161 12/24/2017   HDL 78 12/24/2017   LDLCALC 65 12/24/2017   TRIG 88 12/24/2017      Wt Readings from Last 3 Encounters:  03/01/18 164 lb (74.4 kg)  12/23/17 161 lb 3.2 oz (73.1 kg)  02/21/17 159 lb (72.1 kg)       ASSESSMENT AND PLAN:  Mixed hyperlipidemia - Plan: EKG 12-Lead Recommended she continue on her Crestor  Goal LDL less than 70 Other secondary hypertension Blood pressure is well controlled on today's visit. No changes made to the medications.  Aphasia - Plan: LONG TERM MONITOR (3-14 DAYS) Etiology unclear Neurology has requested rule out for atrial fibrillation No stroke seen on MRI MRI did confirm significant vertebral vascular disease We have placed a extended Holter monitor, 2 wk ZIo patch Stressed importance of aggressive lipid management   Disposition:    F/U  As needed  Thorough review of hospital records   Total encounter time more than 45 minutes  Greater than 50% was spent in counseling and coordination of care with the patient    Orders Placed This Encounter  Procedures  . LONG TERM MONITOR (3-14 DAYS)  . EKG 12-Lead     Signed, Esmond Plants, M.D., Ph.D. 03/01/2018  [place], [place]

## 2018-03-01 ENCOUNTER — Ambulatory Visit (INDEPENDENT_AMBULATORY_CARE_PROVIDER_SITE_OTHER): Payer: Medicare Other | Admitting: Cardiovascular Disease

## 2018-03-01 ENCOUNTER — Other Ambulatory Visit (INDEPENDENT_AMBULATORY_CARE_PROVIDER_SITE_OTHER): Payer: Medicare Other

## 2018-03-01 ENCOUNTER — Encounter: Payer: Self-pay | Admitting: Cardiovascular Disease

## 2018-03-01 VITALS — BP 140/86 | HR 81 | Ht 58.5 in | Wt 164.0 lb

## 2018-03-01 DIAGNOSIS — G459 Transient cerebral ischemic attack, unspecified: Secondary | ICD-10-CM

## 2018-03-01 DIAGNOSIS — I158 Other secondary hypertension: Secondary | ICD-10-CM | POA: Diagnosis not present

## 2018-03-01 DIAGNOSIS — R4701 Aphasia: Secondary | ICD-10-CM

## 2018-03-01 DIAGNOSIS — R0789 Other chest pain: Secondary | ICD-10-CM

## 2018-03-01 DIAGNOSIS — E782 Mixed hyperlipidemia: Secondary | ICD-10-CM

## 2018-03-01 NOTE — Patient Instructions (Addendum)
Medication Instructions:   No medication changes made  Labwork:  No new labs needed  Testing/Procedures:  We will order a ZIO patch for TIA, aphasia, r/o atrial fib  Follow-Up: It was a pleasure seeing you in the office today. Please call us if you have new issues that need to be addressed before your next appt.  [phone]  Your physician wants you to follow-up in:  As neede  If you need a refill on your cardiac medications before your next appointment, please call your pharmacy.  For educational health videos Log in to : [email or web address] Or : [email or web address], password : [email or web address]

## 2018-03-15 DIAGNOSIS — R4701 Aphasia: Secondary | ICD-10-CM

## 2018-03-15 DIAGNOSIS — G459 Transient cerebral ischemic attack, unspecified: Secondary | ICD-10-CM | POA: Diagnosis not present

## 2018-04-23 ENCOUNTER — Telehealth: Payer: Self-pay | Admitting: Cardiovascular Disease

## 2018-04-23 NOTE — Telephone Encounter (Signed)
Patient calling to discuss zio testing results she doesn't understand them and doesn't know if she should be concerned or if she needs to fu   Please call

## 2018-04-23 NOTE — Telephone Encounter (Signed)
Patient wanted to review monitor results again. She states that her family member was having afib and she wanted to know if there was any on her monitor. Reviewed results of monitor again with her and she verbalized understanding with no further questions.

## 2018-05-23 ENCOUNTER — Other Ambulatory Visit: Payer: Self-pay | Admitting: Internal Medicine

## 2018-05-23 DIAGNOSIS — Z1231 Encounter for screening mammogram for malignant neoplasm of breast: Secondary | ICD-10-CM

## 2018-06-18 ENCOUNTER — Ambulatory Visit
Admission: RE | Admit: 2018-06-18 | Discharge: 2018-06-18 | Disposition: A | Discharge status: Home / Self Care | Payer: Medicare Other | Source: Ambulatory Visit | Attending: Internal Medicine | Admitting: Internal Medicine

## 2018-06-18 DIAGNOSIS — Z1231 Encounter for screening mammogram for malignant neoplasm of breast: Secondary | ICD-10-CM | POA: Insufficient documentation

## 2019-05-05 ENCOUNTER — Other Ambulatory Visit: Payer: Self-pay | Admitting: Internal Medicine

## 2019-05-05 DIAGNOSIS — Z1231 Encounter for screening mammogram for malignant neoplasm of breast: Secondary | ICD-10-CM

## 2019-06-20 ENCOUNTER — Ambulatory Visit
Admission: RE | Admit: 2019-06-20 | Discharge: 2019-06-20 | Disposition: A | Discharge status: Home / Self Care | Payer: Medicare Other | Source: Ambulatory Visit | Attending: Internal Medicine | Admitting: Internal Medicine

## 2019-06-20 DIAGNOSIS — Z1231 Encounter for screening mammogram for malignant neoplasm of breast: Secondary | ICD-10-CM | POA: Diagnosis not present

## 2020-05-12 ENCOUNTER — Other Ambulatory Visit: Payer: Self-pay | Admitting: Internal Medicine

## 2020-05-12 DIAGNOSIS — Z1231 Encounter for screening mammogram for malignant neoplasm of breast: Secondary | ICD-10-CM

## 2020-06-03 LAB — COLOGUARD: COLOGUARD: NEGATIVE

## 2020-06-03 LAB — EXTERNAL GENERIC LAB PROCEDURE: COLOGUARD: NEGATIVE

## 2020-06-23 ENCOUNTER — Ambulatory Visit
Admission: RE | Admit: 2020-06-23 | Discharge: 2020-06-23 | Disposition: A | Discharge status: Home / Self Care | Payer: Medicare Other | Source: Ambulatory Visit | Attending: Internal Medicine | Admitting: Internal Medicine

## 2020-06-23 ENCOUNTER — Other Ambulatory Visit: Payer: Self-pay

## 2020-06-23 DIAGNOSIS — Z1231 Encounter for screening mammogram for malignant neoplasm of breast: Secondary | ICD-10-CM

## 2021-06-08 ENCOUNTER — Other Ambulatory Visit: Payer: Self-pay | Admitting: Internal Medicine

## 2021-06-08 DIAGNOSIS — Z1231 Encounter for screening mammogram for malignant neoplasm of breast: Secondary | ICD-10-CM

## 2021-06-28 ENCOUNTER — Ambulatory Visit
Admission: RE | Admit: 2021-06-28 | Discharge: 2021-06-28 | Disposition: A | Discharge status: Home / Self Care | Payer: Medicare Other | Source: Ambulatory Visit | Attending: Internal Medicine | Admitting: Internal Medicine

## 2021-06-28 ENCOUNTER — Other Ambulatory Visit: Payer: Self-pay

## 2021-06-28 DIAGNOSIS — Z1231 Encounter for screening mammogram for malignant neoplasm of breast: Secondary | ICD-10-CM | POA: Diagnosis not present

## 2022-05-17 ENCOUNTER — Other Ambulatory Visit: Payer: Self-pay | Admitting: Internal Medicine

## 2022-05-17 DIAGNOSIS — Z1231 Encounter for screening mammogram for malignant neoplasm of breast: Secondary | ICD-10-CM

## 2022-07-03 ENCOUNTER — Ambulatory Visit
Admission: RE | Admit: 2022-07-03 | Discharge: 2022-07-03 | Disposition: A | Discharge status: Home / Self Care | Payer: Medicare Other | Source: Ambulatory Visit | Attending: Internal Medicine | Admitting: Internal Medicine

## 2022-07-03 DIAGNOSIS — Z1231 Encounter for screening mammogram for malignant neoplasm of breast: Secondary | ICD-10-CM | POA: Diagnosis present

## 2023-05-22 ENCOUNTER — Other Ambulatory Visit: Payer: Self-pay | Admitting: Internal Medicine

## 2023-05-22 DIAGNOSIS — Z1231 Encounter for screening mammogram for malignant neoplasm of breast: Secondary | ICD-10-CM

## 2023-05-23 ENCOUNTER — Encounter: Payer: Self-pay | Admitting: Internal Medicine

## 2023-05-23 DIAGNOSIS — Z1231 Encounter for screening mammogram for malignant neoplasm of breast: Secondary | ICD-10-CM

## 2023-06-01 LAB — COLOGUARD: COLOGUARD: NEGATIVE

## 2023-06-01 LAB — EXTERNAL GENERIC LAB PROCEDURE: COLOGUARD: NEGATIVE

## 2023-07-09 ENCOUNTER — Ambulatory Visit
Admission: RE | Admit: 2023-07-09 | Discharge: 2023-07-09 | Disposition: A | Discharge status: Home / Self Care | Payer: Medicare Other | Source: Ambulatory Visit | Attending: Internal Medicine | Admitting: Internal Medicine

## 2023-07-09 DIAGNOSIS — Z1231 Encounter for screening mammogram for malignant neoplasm of breast: Secondary | ICD-10-CM | POA: Diagnosis present

## 2024-05-27 ENCOUNTER — Other Ambulatory Visit: Payer: Self-pay | Admitting: Internal Medicine

## 2024-05-27 DIAGNOSIS — Z1231 Encounter for screening mammogram for malignant neoplasm of breast: Secondary | ICD-10-CM

## 2024-07-09 ENCOUNTER — Encounter

## 2024-07-16 ENCOUNTER — Inpatient Hospital Stay: Admission: RE | Admit: 2024-07-16 | Discharge: 2024-07-16 | Attending: Internal Medicine | Admitting: Internal Medicine

## 2024-07-16 DIAGNOSIS — Z1231 Encounter for screening mammogram for malignant neoplasm of breast: Secondary | ICD-10-CM | POA: Diagnosis present
